# Patient Record
Sex: Male | Born: 1940 | Race: White | Hispanic: No | Marital: Married
Health system: Midwestern US, Community
[De-identification: ages and names within clinical notes are randomized; demographics above are authoritative.]

## PROBLEM LIST (undated history)

## (undated) DIAGNOSIS — E119 Type 2 diabetes mellitus without complications: Secondary | ICD-10-CM

## (undated) DIAGNOSIS — I10 Essential (primary) hypertension: Secondary | ICD-10-CM

## (undated) DIAGNOSIS — R011 Cardiac murmur, unspecified: Secondary | ICD-10-CM

## (undated) DIAGNOSIS — I2510 Atherosclerotic heart disease of native coronary artery without angina pectoris: Secondary | ICD-10-CM

## (undated) DIAGNOSIS — E785 Hyperlipidemia, unspecified: Secondary | ICD-10-CM

## (undated) HISTORY — PX: OTHER SURGICAL HISTORY: SHX169

## (undated) HISTORY — DX: Cardiac murmur, unspecified: R01.1

## (undated) HISTORY — PX: INGUINAL HERNIA REPAIR: SUR1180

## (undated) HISTORY — DX: Hyperlipidemia, unspecified: E78.5

## (undated) HISTORY — PX: CARDIAC CATHETERIZATION: SHX172

## (undated) HISTORY — DX: Type 2 diabetes mellitus without complications: E11.9

---

## 2007-10-28 HISTORY — PX: COLONOSCOPY: SHX174

## 2008-05-12 ENCOUNTER — Ambulatory Visit: Payer: Self-pay | Admitting: Gastroenterology

## 2008-05-26 ENCOUNTER — Encounter: Payer: Self-pay | Admitting: Gastroenterology

## 2008-05-26 ENCOUNTER — Ambulatory Visit: Payer: Self-pay | Admitting: Gastroenterology

## 2008-05-29 ENCOUNTER — Encounter: Payer: Self-pay | Admitting: Gastroenterology

## 2010-12-31 ENCOUNTER — Other Ambulatory Visit: Payer: Self-pay | Admitting: Dermatology

## 2011-10-02 ENCOUNTER — Other Ambulatory Visit: Payer: Self-pay | Admitting: Dermatology

## 2012-10-04 ENCOUNTER — Other Ambulatory Visit: Payer: Self-pay | Admitting: Dermatology

## 2013-10-11 ENCOUNTER — Other Ambulatory Visit: Payer: Self-pay | Admitting: Dermatology

## 2014-08-03 ENCOUNTER — Other Ambulatory Visit: Payer: Self-pay | Admitting: Dermatology

## 2014-10-17 ENCOUNTER — Other Ambulatory Visit: Payer: Self-pay | Admitting: Dermatology

## 2015-11-27 DIAGNOSIS — H401131 Primary open-angle glaucoma, bilateral, mild stage: Secondary | ICD-10-CM | POA: Diagnosis not present

## 2015-11-28 DIAGNOSIS — R69 Illness, unspecified: Secondary | ICD-10-CM | POA: Diagnosis not present

## 2015-12-18 ENCOUNTER — Encounter: Payer: Self-pay | Admitting: Gastroenterology

## 2015-12-25 DIAGNOSIS — L570 Actinic keratosis: Secondary | ICD-10-CM | POA: Diagnosis not present

## 2015-12-25 DIAGNOSIS — D485 Neoplasm of uncertain behavior of skin: Secondary | ICD-10-CM | POA: Diagnosis not present

## 2015-12-25 DIAGNOSIS — Z85828 Personal history of other malignant neoplasm of skin: Secondary | ICD-10-CM | POA: Diagnosis not present

## 2016-02-12 DIAGNOSIS — Z6827 Body mass index (BMI) 27.0-27.9, adult: Secondary | ICD-10-CM | POA: Diagnosis not present

## 2016-02-12 DIAGNOSIS — E119 Type 2 diabetes mellitus without complications: Secondary | ICD-10-CM | POA: Diagnosis not present

## 2016-02-12 DIAGNOSIS — R3915 Urgency of urination: Secondary | ICD-10-CM | POA: Diagnosis not present

## 2016-03-31 DIAGNOSIS — H401131 Primary open-angle glaucoma, bilateral, mild stage: Secondary | ICD-10-CM | POA: Diagnosis not present

## 2016-04-08 DIAGNOSIS — L570 Actinic keratosis: Secondary | ICD-10-CM | POA: Diagnosis not present

## 2016-04-08 DIAGNOSIS — Z85828 Personal history of other malignant neoplasm of skin: Secondary | ICD-10-CM | POA: Diagnosis not present

## 2016-07-07 DIAGNOSIS — Z961 Presence of intraocular lens: Secondary | ICD-10-CM | POA: Diagnosis not present

## 2016-07-07 DIAGNOSIS — E119 Type 2 diabetes mellitus without complications: Secondary | ICD-10-CM | POA: Diagnosis not present

## 2016-07-07 DIAGNOSIS — Z7984 Long term (current) use of oral hypoglycemic drugs: Secondary | ICD-10-CM | POA: Diagnosis not present

## 2016-07-07 DIAGNOSIS — H401131 Primary open-angle glaucoma, bilateral, mild stage: Secondary | ICD-10-CM | POA: Diagnosis not present

## 2016-07-31 DIAGNOSIS — D1801 Hemangioma of skin and subcutaneous tissue: Secondary | ICD-10-CM | POA: Diagnosis not present

## 2016-07-31 DIAGNOSIS — L821 Other seborrheic keratosis: Secondary | ICD-10-CM | POA: Diagnosis not present

## 2016-07-31 DIAGNOSIS — D485 Neoplasm of uncertain behavior of skin: Secondary | ICD-10-CM | POA: Diagnosis not present

## 2016-07-31 DIAGNOSIS — D045 Carcinoma in situ of skin of trunk: Secondary | ICD-10-CM | POA: Diagnosis not present

## 2016-07-31 DIAGNOSIS — L570 Actinic keratosis: Secondary | ICD-10-CM | POA: Diagnosis not present

## 2016-07-31 DIAGNOSIS — D0462 Carcinoma in situ of skin of left upper limb, including shoulder: Secondary | ICD-10-CM | POA: Diagnosis not present

## 2016-07-31 DIAGNOSIS — Z85828 Personal history of other malignant neoplasm of skin: Secondary | ICD-10-CM | POA: Diagnosis not present

## 2016-08-05 DIAGNOSIS — E119 Type 2 diabetes mellitus without complications: Secondary | ICD-10-CM | POA: Diagnosis not present

## 2016-08-05 DIAGNOSIS — Z125 Encounter for screening for malignant neoplasm of prostate: Secondary | ICD-10-CM | POA: Diagnosis not present

## 2016-08-05 DIAGNOSIS — E784 Other hyperlipidemia: Secondary | ICD-10-CM | POA: Diagnosis not present

## 2016-08-08 DIAGNOSIS — Z1212 Encounter for screening for malignant neoplasm of rectum: Secondary | ICD-10-CM | POA: Diagnosis not present

## 2016-08-12 DIAGNOSIS — E119 Type 2 diabetes mellitus without complications: Secondary | ICD-10-CM | POA: Diagnosis not present

## 2016-08-12 DIAGNOSIS — Z23 Encounter for immunization: Secondary | ICD-10-CM | POA: Diagnosis not present

## 2016-08-12 DIAGNOSIS — R3915 Urgency of urination: Secondary | ICD-10-CM | POA: Diagnosis not present

## 2016-08-12 DIAGNOSIS — Z0000 Encounter for general adult medical examination without abnormal findings: Secondary | ICD-10-CM | POA: Diagnosis not present

## 2016-08-12 DIAGNOSIS — B001 Herpesviral vesicular dermatitis: Secondary | ICD-10-CM | POA: Diagnosis not present

## 2016-08-12 DIAGNOSIS — Z6827 Body mass index (BMI) 27.0-27.9, adult: Secondary | ICD-10-CM | POA: Diagnosis not present

## 2016-08-12 DIAGNOSIS — E784 Other hyperlipidemia: Secondary | ICD-10-CM | POA: Diagnosis not present

## 2016-08-12 DIAGNOSIS — Z1389 Encounter for screening for other disorder: Secondary | ICD-10-CM | POA: Diagnosis not present

## 2016-09-08 DIAGNOSIS — R69 Illness, unspecified: Secondary | ICD-10-CM | POA: Diagnosis not present

## 2017-01-20 DIAGNOSIS — H401131 Primary open-angle glaucoma, bilateral, mild stage: Secondary | ICD-10-CM | POA: Diagnosis not present

## 2017-02-24 DIAGNOSIS — R69 Illness, unspecified: Secondary | ICD-10-CM | POA: Diagnosis not present

## 2017-02-25 DIAGNOSIS — M25551 Pain in right hip: Secondary | ICD-10-CM | POA: Diagnosis not present

## 2017-02-25 DIAGNOSIS — E119 Type 2 diabetes mellitus without complications: Secondary | ICD-10-CM | POA: Diagnosis not present

## 2017-02-25 DIAGNOSIS — Z6827 Body mass index (BMI) 27.0-27.9, adult: Secondary | ICD-10-CM | POA: Diagnosis not present

## 2017-02-25 DIAGNOSIS — E784 Other hyperlipidemia: Secondary | ICD-10-CM | POA: Diagnosis not present

## 2017-06-10 DIAGNOSIS — H401131 Primary open-angle glaucoma, bilateral, mild stage: Secondary | ICD-10-CM | POA: Diagnosis not present

## 2017-08-04 DIAGNOSIS — D1801 Hemangioma of skin and subcutaneous tissue: Secondary | ICD-10-CM | POA: Diagnosis not present

## 2017-08-04 DIAGNOSIS — D0359 Melanoma in situ of other part of trunk: Secondary | ICD-10-CM | POA: Diagnosis not present

## 2017-08-04 DIAGNOSIS — L821 Other seborrheic keratosis: Secondary | ICD-10-CM | POA: Diagnosis not present

## 2017-08-04 DIAGNOSIS — D485 Neoplasm of uncertain behavior of skin: Secondary | ICD-10-CM | POA: Diagnosis not present

## 2017-08-04 DIAGNOSIS — L570 Actinic keratosis: Secondary | ICD-10-CM | POA: Diagnosis not present

## 2017-08-04 DIAGNOSIS — Z85828 Personal history of other malignant neoplasm of skin: Secondary | ICD-10-CM | POA: Diagnosis not present

## 2017-08-07 DIAGNOSIS — E7849 Other hyperlipidemia: Secondary | ICD-10-CM | POA: Diagnosis not present

## 2017-08-07 DIAGNOSIS — Z125 Encounter for screening for malignant neoplasm of prostate: Secondary | ICD-10-CM | POA: Diagnosis not present

## 2017-08-07 DIAGNOSIS — Z0000 Encounter for general adult medical examination without abnormal findings: Secondary | ICD-10-CM | POA: Diagnosis not present

## 2017-08-07 DIAGNOSIS — E119 Type 2 diabetes mellitus without complications: Secondary | ICD-10-CM | POA: Diagnosis not present

## 2017-08-14 DIAGNOSIS — R69 Illness, unspecified: Secondary | ICD-10-CM | POA: Diagnosis not present

## 2017-08-18 DIAGNOSIS — D0359 Melanoma in situ of other part of trunk: Secondary | ICD-10-CM | POA: Diagnosis not present

## 2017-08-18 DIAGNOSIS — C4359 Malignant melanoma of other part of trunk: Secondary | ICD-10-CM | POA: Diagnosis not present

## 2017-08-18 DIAGNOSIS — Z85828 Personal history of other malignant neoplasm of skin: Secondary | ICD-10-CM | POA: Diagnosis not present

## 2017-08-24 DIAGNOSIS — Z1212 Encounter for screening for malignant neoplasm of rectum: Secondary | ICD-10-CM | POA: Diagnosis not present

## 2017-09-11 DIAGNOSIS — R030 Elevated blood-pressure reading, without diagnosis of hypertension: Secondary | ICD-10-CM | POA: Diagnosis not present

## 2017-09-11 DIAGNOSIS — R42 Dizziness and giddiness: Secondary | ICD-10-CM | POA: Diagnosis not present

## 2017-09-11 DIAGNOSIS — Z6827 Body mass index (BMI) 27.0-27.9, adult: Secondary | ICD-10-CM | POA: Diagnosis not present

## 2017-09-22 DIAGNOSIS — R69 Illness, unspecified: Secondary | ICD-10-CM | POA: Diagnosis not present

## 2017-09-29 DIAGNOSIS — E119 Type 2 diabetes mellitus without complications: Secondary | ICD-10-CM | POA: Diagnosis not present

## 2017-10-13 DIAGNOSIS — Z0000 Encounter for general adult medical examination without abnormal findings: Secondary | ICD-10-CM | POA: Diagnosis not present

## 2017-10-13 DIAGNOSIS — E119 Type 2 diabetes mellitus without complications: Secondary | ICD-10-CM | POA: Diagnosis not present

## 2017-10-13 DIAGNOSIS — E7849 Other hyperlipidemia: Secondary | ICD-10-CM | POA: Diagnosis not present

## 2017-10-13 DIAGNOSIS — Z1389 Encounter for screening for other disorder: Secondary | ICD-10-CM | POA: Diagnosis not present

## 2017-10-13 DIAGNOSIS — Z6828 Body mass index (BMI) 28.0-28.9, adult: Secondary | ICD-10-CM | POA: Diagnosis not present

## 2017-10-13 DIAGNOSIS — R69 Illness, unspecified: Secondary | ICD-10-CM | POA: Diagnosis not present

## 2017-10-13 DIAGNOSIS — R030 Elevated blood-pressure reading, without diagnosis of hypertension: Secondary | ICD-10-CM | POA: Diagnosis not present

## 2018-01-07 DIAGNOSIS — H401131 Primary open-angle glaucoma, bilateral, mild stage: Secondary | ICD-10-CM | POA: Diagnosis not present

## 2018-02-11 DIAGNOSIS — E1169 Type 2 diabetes mellitus with other specified complication: Secondary | ICD-10-CM | POA: Diagnosis not present

## 2018-02-11 DIAGNOSIS — Z6824 Body mass index (BMI) 24.0-24.9, adult: Secondary | ICD-10-CM | POA: Diagnosis not present

## 2018-02-11 DIAGNOSIS — R030 Elevated blood-pressure reading, without diagnosis of hypertension: Secondary | ICD-10-CM | POA: Diagnosis not present

## 2018-02-11 DIAGNOSIS — E7849 Other hyperlipidemia: Secondary | ICD-10-CM | POA: Diagnosis not present

## 2018-03-12 DIAGNOSIS — R69 Illness, unspecified: Secondary | ICD-10-CM | POA: Diagnosis not present

## 2018-04-08 DIAGNOSIS — H401131 Primary open-angle glaucoma, bilateral, mild stage: Secondary | ICD-10-CM | POA: Diagnosis not present

## 2018-06-17 ENCOUNTER — Encounter: Payer: Self-pay | Admitting: Gastroenterology

## 2018-06-23 ENCOUNTER — Telehealth: Payer: Self-pay | Admitting: Gastroenterology

## 2018-06-23 NOTE — Telephone Encounter (Signed)
Left message for patient to call back  

## 2018-06-24 NOTE — Telephone Encounter (Signed)
I explained to the patient that we do recommend colon cancer screenings through the age of 46.  He is scheduled for colon and pre visit in September

## 2018-07-02 ENCOUNTER — Ambulatory Visit (AMBULATORY_SURGERY_CENTER): Payer: Self-pay

## 2018-07-02 VITALS — Ht 68.0 in | Wt 190.0 lb

## 2018-07-02 DIAGNOSIS — Z86010 Personal history of colonic polyps: Secondary | ICD-10-CM

## 2018-07-02 MED ORDER — NA SULFATE-K SULFATE-MG SULF 17.5-3.13-1.6 GM/177ML PO SOLN: 1.0000 | Freq: Once | ORAL | 0 refills | Status: AC

## 2018-07-02 NOTE — Progress Notes (Signed)
Per pt, no allergies to soy or egg products.Pt not taking any weight loss meds or using  O2 at home.  Pt refused emmi video. 

## 2018-07-03 DIAGNOSIS — Z23 Encounter for immunization: Secondary | ICD-10-CM | POA: Diagnosis not present

## 2018-07-06 DIAGNOSIS — H401112 Primary open-angle glaucoma, right eye, moderate stage: Secondary | ICD-10-CM | POA: Diagnosis not present

## 2018-07-06 DIAGNOSIS — H401121 Primary open-angle glaucoma, left eye, mild stage: Secondary | ICD-10-CM | POA: Diagnosis not present

## 2018-07-09 ENCOUNTER — Encounter: Payer: Self-pay | Admitting: Gastroenterology

## 2018-07-13 DIAGNOSIS — D2371 Other benign neoplasm of skin of right lower limb, including hip: Secondary | ICD-10-CM | POA: Diagnosis not present

## 2018-07-13 DIAGNOSIS — Z85828 Personal history of other malignant neoplasm of skin: Secondary | ICD-10-CM | POA: Diagnosis not present

## 2018-07-13 DIAGNOSIS — Z85820 Personal history of malignant melanoma of skin: Secondary | ICD-10-CM | POA: Diagnosis not present

## 2018-07-13 DIAGNOSIS — L821 Other seborrheic keratosis: Secondary | ICD-10-CM | POA: Diagnosis not present

## 2018-07-13 DIAGNOSIS — L570 Actinic keratosis: Secondary | ICD-10-CM | POA: Diagnosis not present

## 2018-07-19 DIAGNOSIS — M79672 Pain in left foot: Secondary | ICD-10-CM | POA: Diagnosis not present

## 2018-07-19 DIAGNOSIS — E1169 Type 2 diabetes mellitus with other specified complication: Secondary | ICD-10-CM | POA: Diagnosis not present

## 2018-08-06 ENCOUNTER — Encounter: Payer: Self-pay | Admitting: Gastroenterology

## 2018-08-06 ENCOUNTER — Encounter

## 2018-08-25 ENCOUNTER — Encounter: Payer: Self-pay | Admitting: Gastroenterology

## 2018-09-08 ENCOUNTER — Encounter: Payer: Self-pay | Admitting: Gastroenterology

## 2018-10-01 DIAGNOSIS — R69 Illness, unspecified: Secondary | ICD-10-CM | POA: Diagnosis not present

## 2018-10-05 DIAGNOSIS — E119 Type 2 diabetes mellitus without complications: Secondary | ICD-10-CM | POA: Diagnosis not present

## 2018-10-05 DIAGNOSIS — H401131 Primary open-angle glaucoma, bilateral, mild stage: Secondary | ICD-10-CM | POA: Diagnosis not present

## 2018-10-05 DIAGNOSIS — Z961 Presence of intraocular lens: Secondary | ICD-10-CM | POA: Diagnosis not present

## 2018-10-05 DIAGNOSIS — H26493 Other secondary cataract, bilateral: Secondary | ICD-10-CM | POA: Diagnosis not present

## 2018-10-08 DIAGNOSIS — R82998 Other abnormal findings in urine: Secondary | ICD-10-CM | POA: Diagnosis not present

## 2018-10-08 DIAGNOSIS — E7849 Other hyperlipidemia: Secondary | ICD-10-CM | POA: Diagnosis not present

## 2018-10-08 DIAGNOSIS — Z125 Encounter for screening for malignant neoplasm of prostate: Secondary | ICD-10-CM | POA: Diagnosis not present

## 2018-10-08 DIAGNOSIS — E1169 Type 2 diabetes mellitus with other specified complication: Secondary | ICD-10-CM | POA: Diagnosis not present

## 2018-10-12 ENCOUNTER — Ambulatory Visit (AMBULATORY_SURGERY_CENTER): Payer: Medicare HMO | Admitting: Gastroenterology

## 2018-10-12 ENCOUNTER — Encounter: Payer: Self-pay | Admitting: Gastroenterology

## 2018-10-12 VITALS — BP 163/89 | HR 68 | Temp 97.8°F | Resp 24 | Ht 68.0 in | Wt 190.0 lb

## 2018-10-12 DIAGNOSIS — Z1212 Encounter for screening for malignant neoplasm of rectum: Secondary | ICD-10-CM | POA: Diagnosis not present

## 2018-10-12 DIAGNOSIS — D120 Benign neoplasm of cecum: Secondary | ICD-10-CM | POA: Diagnosis not present

## 2018-10-12 DIAGNOSIS — Z1211 Encounter for screening for malignant neoplasm of colon: Secondary | ICD-10-CM

## 2018-10-12 DIAGNOSIS — Z86010 Personal history of colonic polyps: Secondary | ICD-10-CM | POA: Diagnosis not present

## 2018-10-12 MED ORDER — SODIUM CHLORIDE 0.9 % IV SOLN: 500.0000 mL | Freq: Once | INTRAVENOUS | Status: DC

## 2018-10-12 NOTE — Progress Notes (Signed)
Pt's states no medical or surgical changes since previsit or office visit. 

## 2018-10-12 NOTE — Op Note (Signed)
[place] Patient Name: Anthony Adkins Procedure Date: 10/12/2018 12:06 PM MRN: [id number] Endoscopist: Ladene Artist , MD Age: 77 Referring MD:  Date of Birth: 1940/11/04 Gender: Male Account #: [id number] Procedure:                Colonoscopy Indications:              Screening for colorectal malignant neoplasm Medicines:                Monitored Anesthesia Care Procedure:                Pre-Anesthesia Assessment:                           - Prior to the procedure, a History and Physical                            was performed, and patient medications and                            allergies were reviewed. The patient's tolerance of                            previous anesthesia was also reviewed. The risks                            and benefits of the procedure and the sedation                            options and risks were discussed with the patient.                            All questions were answered, and informed consent                            was obtained. Prior Anticoagulants: The patient has                            taken no previous anticoagulant or antiplatelet                            agents. ASA Grade Assessment: II - A patient with                            mild systemic disease. After reviewing the risks                            and benefits, the patient was deemed in                            satisfactory condition to undergo the procedure.                           After obtaining informed consent, the colonoscope  was passed under direct vision. Throughout the                            procedure, the patient's blood pressure, pulse, and                            oxygen saturations were monitored continuously. The                            Colonoscope was introduced through the anus and                            advanced to the the cecum, identified by                            appendiceal orifice and  ileocecal valve. The                            ileocecal valve, appendiceal orifice, and rectum                            were photographed. The quality of the bowel                            preparation was excellent. The patient tolerated                            the procedure well. The colonoscopy was somewhat                            difficult due to a tortuous colon. Successful                            completion of the procedure was aided by                            withdrawing and reinserting the scope,                            straightening and shortening the scope to obtain                            bowel loop reduction, using scope torsion and                            applying abdominal pressure. Scope In: 12:08:40 PM Scope Out: 12:27:22 PM Scope Withdrawal Time: 0 hours 14 minutes 5 seconds  Total Procedure Duration: 0 hours 18 minutes 42 seconds  Findings:                 The perianal and digital rectal examinations were                            normal.  A 8 mm polyp was found in the cecum. The polyp was                            sessile. The polyp was removed with a cold snare.                            Resection and retrieval were complete.                           Multiple medium-mouthed diverticula were found in                            the left colon.                           Internal hemorrhoids were found during                            retroflexion. The hemorrhoids were medium-sized and                            Grade I (internal hemorrhoids that do not prolapse).                           The exam was otherwise without abnormality on                            direct and retroflexion views. Complications:            No immediate complications. Estimated blood loss:                            None. Estimated Blood Loss:     Estimated blood loss: none. Impression:               - One 8 mm polyp in the cecum, removed  with a cold                            snare. Resected and retrieved.                           - Diverticulosis in the left colon.                           - Internal hemorrhoids.                           - The examination was otherwise normal on direct                            and retroflexion views. Recommendation:           - Patient has a contact number available for                            emergencies. The signs and symptoms of potential  delayed complications were discussed with the                            patient. Return to normal activities tomorrow.                            Written discharge instructions were provided to the                            patient.                           - High fiber diet.                           - Continue present medications.                           - Await pathology results.                           - No repeat colonoscopy due to age. Ladene Artist, MD 10/12/2018 12:30:48 PM This report has been signed electronically.

## 2018-10-12 NOTE — Progress Notes (Signed)
Called to room to assist during endoscopic procedure.  Patient ID and intended procedure confirmed with present staff. Received instructions for my participation in the procedure from the performing physician.  

## 2018-10-12 NOTE — Patient Instructions (Signed)
**  Handouts given on polyps, diverticulosis, hemorrhoids, and a high fiber diet**   YOU HAD AN ENDOSCOPIC PROCEDURE TODAY: Refer to the procedure report and other information in the discharge instructions given to you for any specific questions about what was found during the examination. If this information does not answer your questions, please call [place] office at [phone] to clarify.   YOU SHOULD EXPECT: Some feelings of bloating in the abdomen. Passage of more gas than usual. Walking can help get rid of the air that was put into your GI tract during the procedure and reduce the bloating. If you had a lower endoscopy (such as a colonoscopy or flexible sigmoidoscopy) you may notice spotting of blood in your stool or on the toilet paper. Some abdominal soreness may be present for a day or two, also.  DIET: Your first meal following the procedure should be a light meal and then it is ok to progress to your normal diet. A half-sandwich or bowl of soup is an example of a good first meal. Heavy or fried foods are harder to digest and may make you feel nauseous or bloated. Drink plenty of fluids but you should avoid alcoholic beverages for 24 hours. If you had a esophageal dilation, please see attached instructions for diet.    ACTIVITY: Your care partner should take you home directly after the procedure. You should plan to take it easy, moving slowly for the rest of the day. You can resume normal activity the day after the procedure however YOU SHOULD NOT DRIVE, use power tools, machinery or perform tasks that involve climbing or major physical exertion for 24 hours (because of the sedation medicines used during the test).   SYMPTOMS TO REPORT IMMEDIATELY: A gastroenterologist can be reached at any hour. Please call [phone]  for any of the following symptoms:  Following lower endoscopy (colonoscopy, flexible sigmoidoscopy) Excessive amounts of blood in the stool  Significant tenderness,  worsening of abdominal pains  Swelling of the abdomen that is new, acute  Fever of 100 or higher    FOLLOW UP:  If any biopsies were taken you will be contacted by phone or by letter within the next 1-3 weeks. Call [phone]  if you have not heard about the biopsies in 3 weeks.  Please also call with any specific questions about appointments or follow up tests.

## 2018-10-12 NOTE — Progress Notes (Signed)
A/ox3, pleased with MAC, report to RN 

## 2018-10-13 ENCOUNTER — Telehealth: Payer: Self-pay

## 2018-10-13 ENCOUNTER — Telehealth: Payer: Self-pay | Admitting: *Deleted

## 2018-10-13 DIAGNOSIS — E7849 Other hyperlipidemia: Secondary | ICD-10-CM | POA: Diagnosis not present

## 2018-10-13 NOTE — Telephone Encounter (Signed)
  Follow up Call-  Call back number 10/12/2018  Post procedure Call Back phone  # [phone]  Permission to leave phone message Yes  Some recent data might be hidden     Patient questions:  Message left to call us if necessary.

## 2018-10-13 NOTE — Telephone Encounter (Signed)
  Follow up Call-  Call back number 10/12/2018  Post procedure Call Back phone  # [phone]  Permission to leave phone message Yes  Some recent data might be hidden     Patient questions:  Do you have a fever, pain , or abdominal swelling? No. Pain Score  0 *  Have you tolerated food without any problems? Yes.    Have you been able to return to your normal activities? Yes.    Do you have any questions about your discharge instructions: Diet   No. Medications  No. Follow up visit  No.  Do you have questions or concerns about your Care? No.  Actions: * If pain score is 4 or above: No action needed, pain <4.  No problems noted per pt. maw

## 2018-10-15 DIAGNOSIS — Z1389 Encounter for screening for other disorder: Secondary | ICD-10-CM | POA: Diagnosis not present

## 2018-10-15 DIAGNOSIS — E7849 Other hyperlipidemia: Secondary | ICD-10-CM | POA: Diagnosis not present

## 2018-10-15 DIAGNOSIS — Z6828 Body mass index (BMI) 28.0-28.9, adult: Secondary | ICD-10-CM | POA: Diagnosis not present

## 2018-10-15 DIAGNOSIS — E1169 Type 2 diabetes mellitus with other specified complication: Secondary | ICD-10-CM | POA: Diagnosis not present

## 2018-10-15 DIAGNOSIS — Z0000 Encounter for general adult medical examination without abnormal findings: Secondary | ICD-10-CM | POA: Diagnosis not present

## 2018-10-15 DIAGNOSIS — R030 Elevated blood-pressure reading, without diagnosis of hypertension: Secondary | ICD-10-CM | POA: Diagnosis not present

## 2018-10-18 ENCOUNTER — Encounter: Payer: Self-pay | Admitting: Gastroenterology

## 2018-11-05 NOTE — Telephone Encounter (Signed)
Error

## 2018-11-24 DIAGNOSIS — R509 Fever, unspecified: Secondary | ICD-10-CM | POA: Diagnosis not present

## 2018-12-22 DIAGNOSIS — R69 Illness, unspecified: Secondary | ICD-10-CM | POA: Diagnosis not present

## 2018-12-26 DIAGNOSIS — R69 Illness, unspecified: Secondary | ICD-10-CM | POA: Diagnosis not present

## 2019-03-22 DIAGNOSIS — D1801 Hemangioma of skin and subcutaneous tissue: Secondary | ICD-10-CM | POA: Diagnosis not present

## 2019-03-22 DIAGNOSIS — Z85820 Personal history of malignant melanoma of skin: Secondary | ICD-10-CM | POA: Diagnosis not present

## 2019-03-22 DIAGNOSIS — Z85828 Personal history of other malignant neoplasm of skin: Secondary | ICD-10-CM | POA: Diagnosis not present

## 2019-03-22 DIAGNOSIS — L570 Actinic keratosis: Secondary | ICD-10-CM | POA: Diagnosis not present

## 2019-03-22 DIAGNOSIS — D045 Carcinoma in situ of skin of trunk: Secondary | ICD-10-CM | POA: Diagnosis not present

## 2019-03-22 DIAGNOSIS — L821 Other seborrheic keratosis: Secondary | ICD-10-CM | POA: Diagnosis not present

## 2019-04-15 DIAGNOSIS — E1169 Type 2 diabetes mellitus with other specified complication: Secondary | ICD-10-CM | POA: Diagnosis not present

## 2019-04-20 DIAGNOSIS — E785 Hyperlipidemia, unspecified: Secondary | ICD-10-CM | POA: Diagnosis not present

## 2019-04-20 DIAGNOSIS — E1169 Type 2 diabetes mellitus with other specified complication: Secondary | ICD-10-CM | POA: Diagnosis not present

## 2019-04-20 DIAGNOSIS — R69 Illness, unspecified: Secondary | ICD-10-CM | POA: Diagnosis not present

## 2019-04-20 DIAGNOSIS — R030 Elevated blood-pressure reading, without diagnosis of hypertension: Secondary | ICD-10-CM | POA: Diagnosis not present

## 2019-04-22 DIAGNOSIS — R69 Illness, unspecified: Secondary | ICD-10-CM | POA: Diagnosis not present

## 2019-06-06 ENCOUNTER — Other Ambulatory Visit: Payer: Self-pay

## 2019-06-06 DIAGNOSIS — Z20822 Contact with and (suspected) exposure to covid-19: Secondary | ICD-10-CM

## 2019-06-07 LAB — NOVEL CORONAVIRUS, NAA: SARS-CoV-2, NAA: NOT DETECTED

## 2019-06-17 DIAGNOSIS — Z23 Encounter for immunization: Secondary | ICD-10-CM | POA: Diagnosis not present

## 2019-06-22 DIAGNOSIS — R69 Illness, unspecified: Secondary | ICD-10-CM | POA: Diagnosis not present

## 2019-06-23 DIAGNOSIS — R69 Illness, unspecified: Secondary | ICD-10-CM | POA: Diagnosis not present

## 2019-07-20 DIAGNOSIS — R69 Illness, unspecified: Secondary | ICD-10-CM | POA: Diagnosis not present

## 2019-08-05 DIAGNOSIS — E1169 Type 2 diabetes mellitus with other specified complication: Secondary | ICD-10-CM | POA: Diagnosis not present

## 2019-08-25 DIAGNOSIS — R69 Illness, unspecified: Secondary | ICD-10-CM | POA: Diagnosis not present

## 2019-09-06 DIAGNOSIS — M79671 Pain in right foot: Secondary | ICD-10-CM | POA: Diagnosis not present

## 2019-09-06 DIAGNOSIS — M7661 Achilles tendinitis, right leg: Secondary | ICD-10-CM | POA: Diagnosis not present

## 2019-10-10 DIAGNOSIS — E119 Type 2 diabetes mellitus without complications: Secondary | ICD-10-CM | POA: Diagnosis not present

## 2019-10-11 DIAGNOSIS — R82998 Other abnormal findings in urine: Secondary | ICD-10-CM | POA: Diagnosis not present

## 2019-10-11 DIAGNOSIS — Z125 Encounter for screening for malignant neoplasm of prostate: Secondary | ICD-10-CM | POA: Diagnosis not present

## 2019-10-11 DIAGNOSIS — E7849 Other hyperlipidemia: Secondary | ICD-10-CM | POA: Diagnosis not present

## 2019-10-11 DIAGNOSIS — E1169 Type 2 diabetes mellitus with other specified complication: Secondary | ICD-10-CM | POA: Diagnosis not present

## 2019-10-18 DIAGNOSIS — Z0000 Encounter for general adult medical examination without abnormal findings: Secondary | ICD-10-CM | POA: Diagnosis not present

## 2019-10-18 DIAGNOSIS — E1169 Type 2 diabetes mellitus with other specified complication: Secondary | ICD-10-CM | POA: Diagnosis not present

## 2019-10-18 DIAGNOSIS — Z1331 Encounter for screening for depression: Secondary | ICD-10-CM | POA: Diagnosis not present

## 2019-10-18 DIAGNOSIS — E785 Hyperlipidemia, unspecified: Secondary | ICD-10-CM | POA: Diagnosis not present

## 2019-10-18 DIAGNOSIS — R030 Elevated blood-pressure reading, without diagnosis of hypertension: Secondary | ICD-10-CM | POA: Diagnosis not present

## 2019-10-18 DIAGNOSIS — R69 Illness, unspecified: Secondary | ICD-10-CM | POA: Diagnosis not present

## 2019-10-18 DIAGNOSIS — M79671 Pain in right foot: Secondary | ICD-10-CM | POA: Diagnosis not present

## 2019-10-19 DIAGNOSIS — Z1212 Encounter for screening for malignant neoplasm of rectum: Secondary | ICD-10-CM | POA: Diagnosis not present

## 2019-10-24 DIAGNOSIS — R69 Illness, unspecified: Secondary | ICD-10-CM | POA: Diagnosis not present

## 2019-12-13 ENCOUNTER — Ambulatory Visit: Payer: Medicare HMO

## 2019-12-13 ENCOUNTER — Other Ambulatory Visit: Payer: Self-pay

## 2019-12-13 ENCOUNTER — Encounter: Payer: Self-pay | Admitting: Podiatry

## 2019-12-13 ENCOUNTER — Ambulatory Visit (INDEPENDENT_AMBULATORY_CARE_PROVIDER_SITE_OTHER): Payer: Medicare HMO | Admitting: Podiatry

## 2019-12-13 VITALS — BP 140/78 | HR 65 | Resp 16

## 2019-12-13 DIAGNOSIS — M722 Plantar fascial fibromatosis: Secondary | ICD-10-CM

## 2019-12-13 DIAGNOSIS — M7661 Achilles tendinitis, right leg: Secondary | ICD-10-CM

## 2019-12-13 MED ORDER — METHYLPREDNISOLONE 4 MG PO TBPK: ORAL_TABLET | ORAL | 0 refills | Status: DC

## 2019-12-13 MED ORDER — MELOXICAM 15 MG PO TABS: 15.0000 mg | ORAL_TABLET | Freq: Every day | ORAL | 3 refills | Status: DC

## 2019-12-13 NOTE — Progress Notes (Signed)
Subjective:  Patient ID: Anthony Adkins, male    DOB: Apr 25, 1941,  MRN: [id number] HPI Chief Complaint  Patient presents with  . Foot Pain    Posterior heel right - aching x 6 weeks, swollen, "injury to the tendon underneath 6 months ago", tried Aleve-PRN, xrays done and brought disc    79 y.o. male presents with the above complaint.   ROS: Denies fever chills nausea vomiting muscle aches pains calf pain back pain chest pain shortness of breath.  States that it hurts most walking down steps.  Past Medical History:  Diagnosis Date  . Diabetes mellitus without complication ([place])   . Heart murmur    had rheumatic fever as a child  . Hyperlipidemia    Past Surgical History:  Procedure Laterality Date  . COLONOSCOPY  2009  . INGUINAL HERNIA REPAIR     left side/over 20 years ago  . lymph nodes     removed left side neck due to cat scratch fever    Current Outpatient Medications:  .  meloxicam (MOBIC) 15 MG tablet, Take 1 tablet (15 mg total) by mouth daily., Disp: 30 tablet, Rfl: 3 .  methylPREDNISolone (MEDROL DOSEPAK) 4 MG TBPK tablet, 6 day dose pack - take as directed, Disp: 21 tablet, Rfl: 0 .  simvastatin (ZOCOR) 10 MG tablet, Take 10 mg by mouth daily., Disp: , Rfl:   Allergies  Allergen Reactions  . Sulfur     Swelling around neck.   Review of Systems Objective:   Vitals:   12/13/19 0829  BP: 140/78  Pulse: 65  Resp: 16    General: Well developed, nourished, in no acute distress, alert and oriented x3   Dermatological: Skin is warm, dry and supple bilateral. Nails x 10 are well maintained; remaining integument appears unremarkable at this time. There are no open sores, no preulcerative lesions, no rash or signs of infection present.  Vascular: Dorsalis Pedis artery and Posterior Tibial artery pedal pulses are 2/4 bilateral with immedate capillary fill time. Pedal hair growth present. No varicosities and no lower extremity edema present bilateral.    Neruologic: Grossly intact via light touch bilateral. Vibratory intact via tuning fork bilateral. Protective threshold with Semmes Wienstein monofilament intact to all pedal sites bilateral. Patellar and Achilles deep tendon reflexes 2+ bilateral. No Babinski or clonus noted bilateral.   Musculoskeletal: No gross boney pedal deformities bilateral. No pain, crepitus, or limitation noted with foot and ankle range of motion bilateral. Muscular strength 5/5 in all groups tested bilateral.  No pain on palpation medial calcaneal tubercle of the right heel though he does have pain on palpation of the distalmost aspect of the Achilles as it inserts on the posterior calcaneus.  There is a large inoculated area nonpulsatile in nature considered to be bone.  Gait: Unassisted, Nonantalgic.    Radiographs:  Radiographs reviewed today 2 views AP of the foot and ankle and a nonweightbearing lateral does demonstrate soft tissue swelling of the distal Achilles and a retrocalcaneal heel spur.  No acute abnormalities are identified.  Assessment & Plan:   Assessment: Distal Achilles tendinopathy with tendinitis.  Right foot.   Plan: Discussed etiology pathology conservative versus surgical therapies at this point went ahead and injected 2 mg of dexamethasone at the point of maximal tenderness subcutaneously not making sure not to inject into the tendon after sterile Betadine skin prep.  He tolerated procedure well without complications.  We will run him on a short dose of methylprednisolone to  assist in the reduction of the inflammation.  At the same time we are going to put him in a cam walker.  We will also extend his anti-inflammatory effect with meloxicam.  We did discuss icing the foot.  I will follow-up with him in 1 month.     Cale Bethard T. [place], [place]

## 2020-01-12 ENCOUNTER — Ambulatory Visit (INDEPENDENT_AMBULATORY_CARE_PROVIDER_SITE_OTHER): Payer: Medicare HMO | Admitting: Podiatry

## 2020-01-12 ENCOUNTER — Other Ambulatory Visit: Payer: Self-pay

## 2020-01-12 DIAGNOSIS — M7661 Achilles tendinitis, right leg: Secondary | ICD-10-CM | POA: Diagnosis not present

## 2020-01-12 NOTE — Progress Notes (Signed)
He presents today for follow-up of his Achilles tendinitis he states that there is been a major improvement from the last injection.  He states that is about 90 to 95% improved at this point.  Objective: Vital signs are stable alert and oriented x3.  Pulses are palpable.  He has no reproducible pain on palpation to the right heel.  Assessment: Resolving retrocalcaneal bursitis and Achilles tendinitis.  Plan: Continue meloxicam and follow-up with me as needed.

## 2020-01-17 DIAGNOSIS — R69 Illness, unspecified: Secondary | ICD-10-CM | POA: Diagnosis not present

## 2020-01-17 DIAGNOSIS — M7661 Achilles tendinitis, right leg: Secondary | ICD-10-CM | POA: Diagnosis not present

## 2020-01-17 DIAGNOSIS — E1169 Type 2 diabetes mellitus with other specified complication: Secondary | ICD-10-CM | POA: Diagnosis not present

## 2020-01-17 DIAGNOSIS — M25551 Pain in right hip: Secondary | ICD-10-CM | POA: Diagnosis not present

## 2020-01-17 DIAGNOSIS — R030 Elevated blood-pressure reading, without diagnosis of hypertension: Secondary | ICD-10-CM | POA: Diagnosis not present

## 2020-02-20 DIAGNOSIS — R69 Illness, unspecified: Secondary | ICD-10-CM | POA: Diagnosis not present

## 2020-03-12 DIAGNOSIS — D2262 Melanocytic nevi of left upper limb, including shoulder: Secondary | ICD-10-CM | POA: Diagnosis not present

## 2020-03-12 DIAGNOSIS — D692 Other nonthrombocytopenic purpura: Secondary | ICD-10-CM | POA: Diagnosis not present

## 2020-03-12 DIAGNOSIS — L821 Other seborrheic keratosis: Secondary | ICD-10-CM | POA: Diagnosis not present

## 2020-03-12 DIAGNOSIS — Z85828 Personal history of other malignant neoplasm of skin: Secondary | ICD-10-CM | POA: Diagnosis not present

## 2020-03-12 DIAGNOSIS — D225 Melanocytic nevi of trunk: Secondary | ICD-10-CM | POA: Diagnosis not present

## 2020-03-12 DIAGNOSIS — L570 Actinic keratosis: Secondary | ICD-10-CM | POA: Diagnosis not present

## 2020-03-12 DIAGNOSIS — Z85820 Personal history of malignant melanoma of skin: Secondary | ICD-10-CM | POA: Diagnosis not present

## 2020-03-14 ENCOUNTER — Ambulatory Visit: Payer: Medicare HMO | Admitting: Podiatrist

## 2020-03-14 ENCOUNTER — Other Ambulatory Visit: Payer: Self-pay

## 2020-03-14 DIAGNOSIS — M7751 Other enthesopathy of right foot: Secondary | ICD-10-CM | POA: Diagnosis not present

## 2020-03-14 DIAGNOSIS — M7661 Achilles tendinitis, right leg: Secondary | ICD-10-CM | POA: Diagnosis not present

## 2020-03-14 MED ORDER — METHYLPREDNISOLONE 4 MG PO TBPK: ORAL_TABLET | ORAL | 1 refills | Status: DC

## 2020-03-21 ENCOUNTER — Encounter: Payer: Self-pay | Admitting: Podiatrist

## 2020-03-21 NOTE — Progress Notes (Signed)
  Chief Complaint  Patient presents with  . Foot Pain    Pt states posterior/lateral aspect of heel and achilles has sharp pain, 2 week duration. Pt states it began after a long walk.     HPI: Patient is 79 y.o. male who presents today for the concerns as listed above.  He states his achilles tendon pain had been doing well then he went on a long walk 2 weeks ago and it flared back up again. He is requesting in injection today   Review of Systems No fevers, chills, nausea, muscle aches, no difficulty breathing, no calf pain, no chest pain or shortness of breath.   Physical Exam  GENERAL APPEARANCE: Alert, conversant. Appropriately groomed. No acute distress.   VASCULAR: Pedal pulses palpable DP and PT bilateral.  Capillary refill time is immediate to all digits,  Proximal to distal cooling it warm to warm.  Digital hair growth is present bilateral   NEUROLOGIC: sensation is intact epicritically and protectively to 5.07 monofilament at 5/5 sites bilateral.  Light touch is intact bilateral, vibratory sensation intact bilateral, achilles tendon reflex is intact bilateral.   MUSCULOSKELETAL: acceptable muscle strength, tone and stability bilateral.  No gross boney pedal deformities noted.  Pain on palpation posterior lateral aspect of the right heel.  Pain with medial to lateral compression and direct pressure noted at the area of retrocalcaneal bursae.  Achilles tendon itself palpated and noted to be intact.   DERMATOLOGIC: skin is warm, supple, and dry.  No open lesions noted.  No rash, no pre ulcerative lesions. Digital nails are asymptomatic.      Assessment   Flare up of retrocalcaneal bursitis and achilles tendonitis  Plan  An injection was agreed upon and performed under sterile technique as stated below.  rx for medrol dose pack also written. He will stay in the cam boot for at least 2 more weeks and will call if any concerns arise.   Procedure: Injection right posterior  lateral heel Discussed alternatives, risks, complications and verbal consent was obtained.  Location: posterior lateral heel- superficial to achilles tendon Skin Prep: Alcohol. Injectate: 0.5cc 0.5% marcaine plain, 0.5 cc 4 mg dexamethasone phosphate Disposition: Patient tolerated procedure well. Injection site dressed with a band-aid.  Post-injection care was discussed and return precautions discussed.

## 2020-04-26 ENCOUNTER — Encounter: Payer: Self-pay | Admitting: Podiatry

## 2020-04-26 ENCOUNTER — Other Ambulatory Visit: Payer: Self-pay

## 2020-04-26 ENCOUNTER — Ambulatory Visit: Payer: Medicare HMO | Admitting: Podiatry

## 2020-04-26 DIAGNOSIS — M7661 Achilles tendinitis, right leg: Secondary | ICD-10-CM

## 2020-04-26 DIAGNOSIS — M7751 Other enthesopathy of right foot: Secondary | ICD-10-CM

## 2020-04-26 NOTE — Progress Notes (Signed)
He presents today for follow-up of his Achilles tendinitis right.  Some days are great other days or crazy he would like to consider other options.  Objective: Vital signs are stable alert oriented x3.  There is no erythema edema cellulitis drainage or odor though he does have pain on palpation to the lateral fibers of the Achilles posteriorly.  Assessment: Insertional Achilles tendinitis retrocalcaneal heel spur tight hamstrings tight gastrosoleus complex.  Plan: Discussed etiology pathology conservative surgical therapies I injected the area with 2 mg of dexamethasone for maximal tenderness lateral aspect of the Achilles not injecting into the Achilles itself.  I will also get him set up with [facility] discal therapy for evaluation and treatment of hamstrings gastrosoleus complex and Achilles tendon.

## 2020-04-27 ENCOUNTER — Telehealth: Payer: Self-pay | Admitting: *Deleted

## 2020-04-27 DIAGNOSIS — M7751 Other enthesopathy of right foot: Secondary | ICD-10-CM

## 2020-04-27 DIAGNOSIS — M7661 Achilles tendinitis, right leg: Secondary | ICD-10-CM

## 2020-04-27 NOTE — Telephone Encounter (Signed)
Faxed required form, [employer] and insurance to [employer].

## 2020-04-27 NOTE — Telephone Encounter (Signed)
-----   Message from Rip Harbour, [facility] sent at 04/26/2020 11:23 AM EDT ----- Regarding: PT PT In house [facility] -   Evaluate and treat - tight hamstring/gastroc and achilles right   Duration: 3 x week x 4 weeks

## 2020-05-07 DIAGNOSIS — E118 Type 2 diabetes mellitus with unspecified complications: Secondary | ICD-10-CM | POA: Diagnosis not present

## 2020-05-07 DIAGNOSIS — M62561 Muscle wasting and atrophy, not elsewhere classified, right lower leg: Secondary | ICD-10-CM | POA: Diagnosis not present

## 2020-05-07 DIAGNOSIS — M7661 Achilles tendinitis, right leg: Secondary | ICD-10-CM | POA: Diagnosis not present

## 2020-05-07 DIAGNOSIS — R269 Unspecified abnormalities of gait and mobility: Secondary | ICD-10-CM | POA: Diagnosis not present

## 2020-05-07 DIAGNOSIS — M25671 Stiffness of right ankle, not elsewhere classified: Secondary | ICD-10-CM | POA: Diagnosis not present

## 2020-05-07 DIAGNOSIS — M25571 Pain in right ankle and joints of right foot: Secondary | ICD-10-CM | POA: Diagnosis not present

## 2020-05-07 DIAGNOSIS — M25471 Effusion, right ankle: Secondary | ICD-10-CM | POA: Diagnosis not present

## 2020-05-09 DIAGNOSIS — M25571 Pain in right ankle and joints of right foot: Secondary | ICD-10-CM | POA: Diagnosis not present

## 2020-05-09 DIAGNOSIS — R269 Unspecified abnormalities of gait and mobility: Secondary | ICD-10-CM | POA: Diagnosis not present

## 2020-05-09 DIAGNOSIS — M7661 Achilles tendinitis, right leg: Secondary | ICD-10-CM | POA: Diagnosis not present

## 2020-05-09 DIAGNOSIS — M25471 Effusion, right ankle: Secondary | ICD-10-CM | POA: Diagnosis not present

## 2020-05-09 DIAGNOSIS — M25671 Stiffness of right ankle, not elsewhere classified: Secondary | ICD-10-CM | POA: Diagnosis not present

## 2020-05-09 DIAGNOSIS — E118 Type 2 diabetes mellitus with unspecified complications: Secondary | ICD-10-CM | POA: Diagnosis not present

## 2020-05-09 DIAGNOSIS — M62561 Muscle wasting and atrophy, not elsewhere classified, right lower leg: Secondary | ICD-10-CM | POA: Diagnosis not present

## 2020-05-11 DIAGNOSIS — M62561 Muscle wasting and atrophy, not elsewhere classified, right lower leg: Secondary | ICD-10-CM | POA: Diagnosis not present

## 2020-05-11 DIAGNOSIS — M25671 Stiffness of right ankle, not elsewhere classified: Secondary | ICD-10-CM | POA: Diagnosis not present

## 2020-05-11 DIAGNOSIS — M25571 Pain in right ankle and joints of right foot: Secondary | ICD-10-CM | POA: Diagnosis not present

## 2020-05-11 DIAGNOSIS — E118 Type 2 diabetes mellitus with unspecified complications: Secondary | ICD-10-CM | POA: Diagnosis not present

## 2020-05-11 DIAGNOSIS — M25471 Effusion, right ankle: Secondary | ICD-10-CM | POA: Diagnosis not present

## 2020-05-11 DIAGNOSIS — M7661 Achilles tendinitis, right leg: Secondary | ICD-10-CM | POA: Diagnosis not present

## 2020-05-11 DIAGNOSIS — R269 Unspecified abnormalities of gait and mobility: Secondary | ICD-10-CM | POA: Diagnosis not present

## 2020-05-14 DIAGNOSIS — M62561 Muscle wasting and atrophy, not elsewhere classified, right lower leg: Secondary | ICD-10-CM | POA: Diagnosis not present

## 2020-05-14 DIAGNOSIS — E118 Type 2 diabetes mellitus with unspecified complications: Secondary | ICD-10-CM | POA: Diagnosis not present

## 2020-05-14 DIAGNOSIS — R269 Unspecified abnormalities of gait and mobility: Secondary | ICD-10-CM | POA: Diagnosis not present

## 2020-05-14 DIAGNOSIS — M25471 Effusion, right ankle: Secondary | ICD-10-CM | POA: Diagnosis not present

## 2020-05-14 DIAGNOSIS — M7661 Achilles tendinitis, right leg: Secondary | ICD-10-CM | POA: Diagnosis not present

## 2020-05-14 DIAGNOSIS — M25671 Stiffness of right ankle, not elsewhere classified: Secondary | ICD-10-CM | POA: Diagnosis not present

## 2020-05-14 DIAGNOSIS — M25571 Pain in right ankle and joints of right foot: Secondary | ICD-10-CM | POA: Diagnosis not present

## 2020-05-16 DIAGNOSIS — R269 Unspecified abnormalities of gait and mobility: Secondary | ICD-10-CM | POA: Diagnosis not present

## 2020-05-16 DIAGNOSIS — M25671 Stiffness of right ankle, not elsewhere classified: Secondary | ICD-10-CM | POA: Diagnosis not present

## 2020-05-16 DIAGNOSIS — M7661 Achilles tendinitis, right leg: Secondary | ICD-10-CM | POA: Diagnosis not present

## 2020-05-16 DIAGNOSIS — E118 Type 2 diabetes mellitus with unspecified complications: Secondary | ICD-10-CM | POA: Diagnosis not present

## 2020-05-16 DIAGNOSIS — M25471 Effusion, right ankle: Secondary | ICD-10-CM | POA: Diagnosis not present

## 2020-05-16 DIAGNOSIS — M62561 Muscle wasting and atrophy, not elsewhere classified, right lower leg: Secondary | ICD-10-CM | POA: Diagnosis not present

## 2020-05-16 DIAGNOSIS — M25571 Pain in right ankle and joints of right foot: Secondary | ICD-10-CM | POA: Diagnosis not present

## 2020-05-18 DIAGNOSIS — M62561 Muscle wasting and atrophy, not elsewhere classified, right lower leg: Secondary | ICD-10-CM | POA: Diagnosis not present

## 2020-05-18 DIAGNOSIS — R269 Unspecified abnormalities of gait and mobility: Secondary | ICD-10-CM | POA: Diagnosis not present

## 2020-05-18 DIAGNOSIS — M25571 Pain in right ankle and joints of right foot: Secondary | ICD-10-CM | POA: Diagnosis not present

## 2020-05-18 DIAGNOSIS — M25671 Stiffness of right ankle, not elsewhere classified: Secondary | ICD-10-CM | POA: Diagnosis not present

## 2020-05-18 DIAGNOSIS — E118 Type 2 diabetes mellitus with unspecified complications: Secondary | ICD-10-CM | POA: Diagnosis not present

## 2020-05-18 DIAGNOSIS — M25471 Effusion, right ankle: Secondary | ICD-10-CM | POA: Diagnosis not present

## 2020-05-18 DIAGNOSIS — M7661 Achilles tendinitis, right leg: Secondary | ICD-10-CM | POA: Diagnosis not present

## 2020-05-21 DIAGNOSIS — R269 Unspecified abnormalities of gait and mobility: Secondary | ICD-10-CM | POA: Diagnosis not present

## 2020-05-21 DIAGNOSIS — M25571 Pain in right ankle and joints of right foot: Secondary | ICD-10-CM | POA: Diagnosis not present

## 2020-05-21 DIAGNOSIS — M62561 Muscle wasting and atrophy, not elsewhere classified, right lower leg: Secondary | ICD-10-CM | POA: Diagnosis not present

## 2020-05-21 DIAGNOSIS — M7661 Achilles tendinitis, right leg: Secondary | ICD-10-CM | POA: Diagnosis not present

## 2020-05-21 DIAGNOSIS — E118 Type 2 diabetes mellitus with unspecified complications: Secondary | ICD-10-CM | POA: Diagnosis not present

## 2020-05-21 DIAGNOSIS — M25471 Effusion, right ankle: Secondary | ICD-10-CM | POA: Diagnosis not present

## 2020-05-21 DIAGNOSIS — M25671 Stiffness of right ankle, not elsewhere classified: Secondary | ICD-10-CM | POA: Diagnosis not present

## 2020-05-24 DIAGNOSIS — E118 Type 2 diabetes mellitus with unspecified complications: Secondary | ICD-10-CM | POA: Diagnosis not present

## 2020-05-24 DIAGNOSIS — M25671 Stiffness of right ankle, not elsewhere classified: Secondary | ICD-10-CM | POA: Diagnosis not present

## 2020-05-24 DIAGNOSIS — M62561 Muscle wasting and atrophy, not elsewhere classified, right lower leg: Secondary | ICD-10-CM | POA: Diagnosis not present

## 2020-05-24 DIAGNOSIS — M25571 Pain in right ankle and joints of right foot: Secondary | ICD-10-CM | POA: Diagnosis not present

## 2020-05-24 DIAGNOSIS — M7661 Achilles tendinitis, right leg: Secondary | ICD-10-CM | POA: Diagnosis not present

## 2020-05-24 DIAGNOSIS — R269 Unspecified abnormalities of gait and mobility: Secondary | ICD-10-CM | POA: Diagnosis not present

## 2020-05-24 DIAGNOSIS — M25471 Effusion, right ankle: Secondary | ICD-10-CM | POA: Diagnosis not present

## 2020-05-29 DIAGNOSIS — E118 Type 2 diabetes mellitus with unspecified complications: Secondary | ICD-10-CM | POA: Diagnosis not present

## 2020-05-29 DIAGNOSIS — M7661 Achilles tendinitis, right leg: Secondary | ICD-10-CM | POA: Diagnosis not present

## 2020-05-29 DIAGNOSIS — M25471 Effusion, right ankle: Secondary | ICD-10-CM | POA: Diagnosis not present

## 2020-05-29 DIAGNOSIS — M62561 Muscle wasting and atrophy, not elsewhere classified, right lower leg: Secondary | ICD-10-CM | POA: Diagnosis not present

## 2020-05-29 DIAGNOSIS — M25671 Stiffness of right ankle, not elsewhere classified: Secondary | ICD-10-CM | POA: Diagnosis not present

## 2020-05-29 DIAGNOSIS — R269 Unspecified abnormalities of gait and mobility: Secondary | ICD-10-CM | POA: Diagnosis not present

## 2020-05-29 DIAGNOSIS — M25571 Pain in right ankle and joints of right foot: Secondary | ICD-10-CM | POA: Diagnosis not present

## 2020-05-31 DIAGNOSIS — M25671 Stiffness of right ankle, not elsewhere classified: Secondary | ICD-10-CM | POA: Diagnosis not present

## 2020-05-31 DIAGNOSIS — M62561 Muscle wasting and atrophy, not elsewhere classified, right lower leg: Secondary | ICD-10-CM | POA: Diagnosis not present

## 2020-05-31 DIAGNOSIS — R269 Unspecified abnormalities of gait and mobility: Secondary | ICD-10-CM | POA: Diagnosis not present

## 2020-05-31 DIAGNOSIS — E118 Type 2 diabetes mellitus with unspecified complications: Secondary | ICD-10-CM | POA: Diagnosis not present

## 2020-05-31 DIAGNOSIS — M7661 Achilles tendinitis, right leg: Secondary | ICD-10-CM | POA: Diagnosis not present

## 2020-05-31 DIAGNOSIS — M25571 Pain in right ankle and joints of right foot: Secondary | ICD-10-CM | POA: Diagnosis not present

## 2020-05-31 DIAGNOSIS — M25471 Effusion, right ankle: Secondary | ICD-10-CM | POA: Diagnosis not present

## 2020-10-17 ENCOUNTER — Emergency Department (HOSPITAL_COMMUNITY): Payer: Medicare HMO

## 2020-10-17 ENCOUNTER — Encounter (HOSPITAL_COMMUNITY): Payer: Self-pay | Admitting: Emergency Medicine

## 2020-10-17 ENCOUNTER — Observation Stay (HOSPITAL_COMMUNITY)
Admission: EM | Admit: 2020-10-17 | Discharge: 2020-10-18 | Disposition: A | Discharge status: Home / Self Care | Payer: Medicare HMO | Attending: Internal Medicine | Admitting: Internal Medicine

## 2020-10-17 ENCOUNTER — Other Ambulatory Visit: Payer: Self-pay

## 2020-10-17 DIAGNOSIS — E785 Hyperlipidemia, unspecified: Secondary | ICD-10-CM | POA: Diagnosis not present

## 2020-10-17 DIAGNOSIS — I499 Cardiac arrhythmia, unspecified: Secondary | ICD-10-CM | POA: Diagnosis not present

## 2020-10-17 DIAGNOSIS — I214 Non-ST elevation (NSTEMI) myocardial infarction: Principal | ICD-10-CM | POA: Insufficient documentation

## 2020-10-17 DIAGNOSIS — R0789 Other chest pain: Secondary | ICD-10-CM | POA: Diagnosis not present

## 2020-10-17 DIAGNOSIS — I491 Atrial premature depolarization: Secondary | ICD-10-CM | POA: Diagnosis not present

## 2020-10-17 DIAGNOSIS — F17210 Nicotine dependence, cigarettes, uncomplicated: Secondary | ICD-10-CM | POA: Insufficient documentation

## 2020-10-17 DIAGNOSIS — Z8249 Family history of ischemic heart disease and other diseases of the circulatory system: Secondary | ICD-10-CM | POA: Diagnosis not present

## 2020-10-17 DIAGNOSIS — R030 Elevated blood-pressure reading, without diagnosis of hypertension: Secondary | ICD-10-CM | POA: Diagnosis not present

## 2020-10-17 DIAGNOSIS — Z95820 Peripheral vascular angioplasty status with implants and grafts: Secondary | ICD-10-CM

## 2020-10-17 DIAGNOSIS — Z7984 Long term (current) use of oral hypoglycemic drugs: Secondary | ICD-10-CM | POA: Insufficient documentation

## 2020-10-17 DIAGNOSIS — I25110 Atherosclerotic heart disease of native coronary artery with unstable angina pectoris: Secondary | ICD-10-CM | POA: Diagnosis not present

## 2020-10-17 DIAGNOSIS — Z20822 Contact with and (suspected) exposure to covid-19: Secondary | ICD-10-CM | POA: Diagnosis not present

## 2020-10-17 DIAGNOSIS — R079 Chest pain, unspecified: Secondary | ICD-10-CM | POA: Diagnosis not present

## 2020-10-17 DIAGNOSIS — R0602 Shortness of breath: Secondary | ICD-10-CM | POA: Diagnosis not present

## 2020-10-17 DIAGNOSIS — I200 Unstable angina: Secondary | ICD-10-CM | POA: Diagnosis present

## 2020-10-17 DIAGNOSIS — Z882 Allergy status to sulfonamides status: Secondary | ICD-10-CM | POA: Insufficient documentation

## 2020-10-17 DIAGNOSIS — E119 Type 2 diabetes mellitus without complications: Secondary | ICD-10-CM

## 2020-10-17 DIAGNOSIS — G4489 Other headache syndrome: Secondary | ICD-10-CM | POA: Diagnosis not present

## 2020-10-17 DIAGNOSIS — R7309 Other abnormal glucose: Secondary | ICD-10-CM | POA: Diagnosis not present

## 2020-10-17 DIAGNOSIS — R69 Illness, unspecified: Secondary | ICD-10-CM | POA: Diagnosis not present

## 2020-10-17 DIAGNOSIS — I2510 Atherosclerotic heart disease of native coronary artery without angina pectoris: Secondary | ICD-10-CM

## 2020-10-17 DIAGNOSIS — I10 Essential (primary) hypertension: Secondary | ICD-10-CM

## 2020-10-17 LAB — HEMOGLOBIN A1C
Hgb A1c MFr Bld: 6.2 % — ABNORMAL HIGH (ref 4.8–5.6)
Mean Plasma Glucose: 131.24 mg/dL

## 2020-10-17 LAB — BASIC METABOLIC PANEL
Anion gap: 10 (ref 5–15)
BUN: 11 mg/dL (ref 8–23)
CO2: 25 mmol/L (ref 22–32)
Calcium: 8.9 mg/dL (ref 8.9–10.3)
Chloride: 103 mmol/L (ref 98–111)
Creatinine, Ser: 0.94 mg/dL (ref 0.61–1.24)
GFR, Estimated: >60 mL/min (ref >60)
Glucose, Bld: 129 mg/dL — ABNORMAL HIGH (ref 70–99)
Potassium: 4.1 mmol/L (ref 3.5–5.1)
Sodium: 138 mmol/L (ref 135–145)

## 2020-10-17 LAB — CBC
HCT: 42.3 % (ref 39.0–52.0)
Hemoglobin: 14.2 g/dL (ref 13.0–17.0)
MCH: 32.8 pg (ref 26.0–34.0)
MCHC: 33.6 g/dL (ref 30.0–36.0)
MCV: 97.7 fL (ref 80.0–100.0)
Platelets: 234 10*3/uL (ref 150–400)
RBC: 4.33 MIL/uL (ref 4.22–5.81)
RDW: 12.1 % (ref 11.5–15.5)
WBC: 11.7 10*3/uL — ABNORMAL HIGH (ref 4.0–10.5)
nRBC: 0 % (ref 0.0–0.2)

## 2020-10-17 LAB — RESP PANEL BY RT-PCR (FLU A&B, COVID) ARPGX2
Influenza A by PCR: NEGATIVE
Influenza B by PCR: NEGATIVE
SARS Coronavirus 2 by RT PCR: NEGATIVE

## 2020-10-17 LAB — CBG MONITORING, ED: Glucose-Capillary: 165 mg/dL — ABNORMAL HIGH (ref 70–99)

## 2020-10-17 LAB — TROPONIN I (HIGH SENSITIVITY)
Troponin I (High Sensitivity): 25 ng/L — ABNORMAL HIGH (ref <18)
Troponin I (High Sensitivity): 26 ng/L — ABNORMAL HIGH (ref <18)

## 2020-10-17 MED ORDER — ONDANSETRON HCL 4 MG/2ML IJ SOLN: 4.0000 mg | Freq: Four times a day (QID) | INTRAMUSCULAR | Status: DC | PRN

## 2020-10-17 MED ORDER — ONE-A-DAY MENS 50+ ADVANTAGE PO TABS: 1.0000 | ORAL_TABLET | Freq: Every day | ORAL | Status: DC

## 2020-10-17 MED ORDER — ASPIRIN EC 81 MG PO TBEC
81.0000 mg | DELAYED_RELEASE_TABLET | Freq: Every day | ORAL | Status: DC
  Administered 2020-10-17: 81 mg via ORAL
  Filled 2020-10-17: qty 1

## 2020-10-17 MED ORDER — INSULIN ASPART 100 UNIT/ML ~~LOC~~ SOLN
0.0000 [IU] | SUBCUTANEOUS | Status: DC
  Administered 2020-10-17: 2 [IU] via SUBCUTANEOUS
  Administered 2020-10-18: 1 [IU] via SUBCUTANEOUS

## 2020-10-17 MED ORDER — ADULT MULTIVITAMIN W/MINERALS CH: 1.0000 | ORAL_TABLET | Freq: Every day | ORAL | Status: DC

## 2020-10-17 MED ORDER — ACETAMINOPHEN 325 MG PO TABS: 650.0000 mg | ORAL_TABLET | ORAL | Status: DC | PRN

## 2020-10-17 MED ORDER — HYDRALAZINE HCL 20 MG/ML IJ SOLN: 10.0000 mg | INTRAMUSCULAR | Status: DC | PRN

## 2020-10-17 MED ORDER — ENOXAPARIN SODIUM 40 MG/0.4ML ~~LOC~~ SOLN
40.0000 mg | Freq: Every day | SUBCUTANEOUS | Status: DC
  Administered 2020-10-17: 40 mg via SUBCUTANEOUS
  Filled 2020-10-17: qty 0.4

## 2020-10-17 NOTE — H&P (Signed)
History and Physical    RAYNOR CALCATERRA RJJ:[id number] DOB: [birth year] DOA: 10/17/2020  PCP: Velna Hatchet, MD  Patient coming from: Home  I have personally briefly reviewed patient's old medical records in [place]  Chief Complaint: CP  HPI: Anthony Adkins is a 79 y.o. male with medical history significant of DM2, HLD, never been diagnosed with HTN previously.  Pt presents to ED with c/o chest "tightness".  Symptoms onset around 11 AM.  HR at home was 40s-50s.  Associated SOB.  Got NTG with EMS which significantly improved symptoms though they are still persistent somewhat at this time.  Symptoms were worst during first 2 hours.   ED Course: Trop flat at 25 and 26  Review of Systems: As per HPI, otherwise all review of systems negative.  Past Medical History:  Diagnosis Date  . Diabetes mellitus without complication ([place])   . Heart murmur    had rheumatic fever as a child  . Hyperlipidemia     Past Surgical History:  Procedure Laterality Date  . COLONOSCOPY  2009  . INGUINAL HERNIA REPAIR     left side/over 20 years ago  . lymph nodes     removed left side neck due to cat scratch fever     reports that he has been smoking cigarettes. He has never used smokeless tobacco. He reports current alcohol use of about 7.0 - 10.0 standard drinks of alcohol per week. He reports previous drug use.  Allergies  Allergen Reactions  . Sulfa Antibiotics Anaphylaxis, Shortness Of Breath, Rash and Other (See Comments)    Throat and the neck both swell    Family History  Problem Relation Age of Onset  . Hypertension Father   . Heart disease Father   . Colon cancer Neg Hx   . Esophageal cancer Neg Hx   . Rectal cancer Neg Hx   . Stomach cancer Neg Hx      Prior to Admission medications   Medication Sig Start Date End Date Taking? Authorizing Provider  BAYER LOW DOSE 81 MG EC tablet Take 81 mg by mouth at bedtime. Swallow whole.   Yes [provider]   fluticasone (FLONASE) 50 MCG/ACT nasal spray Place 2 sprays into both nostrils daily as needed for rhinitis (or seasonal allergies). 09/24/20  Yes [provider]  ibuprofen (ADVIL) 200 MG tablet Take 200 mg by mouth every 6 (six) hours as needed for headache or mild pain.   Yes [provider]  loratadine (CLARITIN) 10 MG tablet Take 10 mg by mouth daily as needed for rhinitis (or seasonal allergies).   Yes [provider]  meloxicam (MOBIC) 15 MG tablet Take 1 tablet (15 mg total) by mouth daily. Patient taking differently: Take 15 mg by mouth daily as needed (for foot pain). 12/13/19  Yes Hyatt, Max T, DPM  METFORMIN HCL PO Take 1 tablet by mouth at bedtime.   Yes [provider]  Multiple Vitamins-Minerals (ONE-A-DAY MENS 50+ ADVANTAGE) TABS Take 1 tablet by mouth daily with breakfast.   Yes [provider]  NON FORMULARY Take 900 mg by mouth See admin instructions. CBD 900 mg gummie- Chew 1 gummie (900 mg) by mouth at bedtime   Yes [provider]    Physical Exam: Vitals:   10/17/20 2054 10/17/20 2115 10/17/20 2125 10/17/20 2145  BP: 137/82 (!) 169/98  (!) 159/87  Pulse: 88 (!) 58  71  Resp: 16 20  18   Temp:  TempSrc:      SpO2: 100% 96%  96%  Weight:   78.5 kg   Height:   5\' 9"  (1.753 m)     Constitutional: NAD, calm, comfortable Eyes: PERRL, lids and conjunctivae normal ENMT: Mucous membranes are moist. Posterior pharynx clear of any exudate or lesions.Normal dentition.  Neck: normal, supple, no masses, no thyromegaly Respiratory: clear to auscultation bilaterally, no wheezing, no crackles. Normal respiratory effort. No accessory muscle use.  Cardiovascular: Regular rate and rhythm, no murmurs / rubs / gallops. No extremity edema. 2+ pedal pulses. No carotid bruits.  Abdomen: no tenderness, no masses palpated. No hepatosplenomegaly. Bowel sounds positive.  Musculoskeletal: no clubbing / cyanosis. No joint deformity upper  and lower extremities. Good ROM, no contractures. Normal muscle tone.  Skin: no rashes, lesions, ulcers. No induration Neurologic: CN 2-12 grossly intact. Sensation intact, DTR normal. Strength 5/5 in all 4.  Psychiatric: Normal judgment and insight. Alert and oriented x 3. Normal mood.    Labs on Admission: I have personally reviewed following labs and imaging studies  CBC: Recent Labs  Lab 10/17/20 1616  WBC 11.7*  HGB 14.2  HCT 42.3  MCV 97.7  PLT 175   Basic Metabolic Panel: Recent Labs  Lab 10/17/20 1616  NA 138  K 4.1  CL 103  CO2 25  GLUCOSE 129*  BUN 11  CREATININE 0.94  CALCIUM 8.9   GFR: Estimated Creatinine Clearance: 63.7 mL/min (by C-G formula based on SCr of 0.94 mg/dL). Liver Function Tests: No results for input(s): AST, ALT, ALKPHOS, BILITOT, PROT, ALBUMIN in the last 168 hours. No results for input(s): LIPASE, AMYLASE in the last 168 hours. No results for input(s): AMMONIA in the last 168 hours. Coagulation Profile: No results for input(s): INR, PROTIME in the last 168 hours. Cardiac Enzymes: No results for input(s): CKTOTAL, CKMB, CKMBINDEX, TROPONINI in the last 168 hours. BNP (last 3 results) No results for input(s): PROBNP in the last 8760 hours. HbA1C: No results for input(s): HGBA1C in the last 72 hours. CBG: No results for input(s): GLUCAP in the last 168 hours. Lipid Profile: No results for input(s): CHOL, HDL, LDLCALC, TRIG, CHOLHDL, LDLDIRECT in the last 72 hours. Thyroid Function Tests: No results for input(s): TSH, T4TOTAL, FREET4, T3FREE, THYROIDAB in the last 72 hours. Anemia Panel: No results for input(s): VITAMINB12, FOLATE, FERRITIN, TIBC, IRON, RETICCTPCT in the last 72 hours. Urine analysis: No results found for: COLORURINE, APPEARANCEUR, LABSPEC, [place], GLUCOSEU, HGBUR, BILIRUBINUR, KETONESUR, PROTEINUR, UROBILINOGEN, NITRITE, LEUKOCYTESUR  Radiological Exams on Admission: DG Chest 2 View  Result Date:  10/17/2020 CLINICAL DATA:  Chest pain EXAM: CHEST - 2 VIEW COMPARISON:  None. FINDINGS: Cardiac shadow is at the upper limits of normal in size. Aortic calcifications are noted. Lungs are well aerated without focal infiltrate or effusion. Degenerative change thoracic spine is noted. IMPRESSION: No acute abnormality noted. Electronically Signed   By: Inez Catalina M.D.   On: 10/17/2020 16:36    EKG: Independently reviewed.  RBBB, LAFB, and frequent PVCs  Assessment/Plan Principal Problem:   Chest pain, rule out acute myocardial infarction Active Problems:   DM2 (diabetes mellitus, type 2) (HCC)   Elevated blood pressure reading    1. CP r/o - 1. CP obs pathway 2. Serial trops 3. Tele monitor 4. NPO after MN 5. Got ASA and NTG x1 with EMS 6. Call Dr. Einar Gip in AM - EDP saw note but wanted obs admit anyhow. 2. DM2 - 1. Hold metformin 2. Sensitive SSI  Q4H 3. Elevated BP reading - 1. No formal HTN diagnosis at baseline 2. Putting pt on PRN hydralazine  DVT prophylaxis: Lovenox Code Status: Full Family Communication: No family in room Disposition Plan: Home after cleared by cardiology Consults called: EDP consulted Dr. Jacinto Halim Admission status: Place in obs   Gisela Lea M. DO [facility]  How to contact the [facility] Attending or Consulting provider 7A - 7P or covering provider during after hours 7P -7A, for this patient?  1. Check the care team in [facility] and look for a) attending/consulting [facility] provider listed and b) the [facility] team listed 2. Log into [email or web address]  Amion Physician Scheduling and messaging for groups and whole hospitals  On call and physician scheduling software for group practices, residents, hospitalists and other medical providers for call, clinic, rotation and shift schedules. On[employer] is a hospital-wide system for scheduling doctors and paging doctors on call. EasyPlot is for scientific plotting and data analysis.  [email or web address]  and use [place]'s  universal password to access. If you do not have the password, please contact the hospital operator.  3. Locate the [facility] provider you are looking for under [facility] and page to a number that you can be directly reached. 4. If you still have difficulty reaching the provider, please page the [facility] (Director on Call) for the Hospitalists listed on amion for assistance.  10/17/2020, 10:15 PM

## 2020-10-17 NOTE — ED Triage Notes (Signed)
Pt BIB [facility] from and urgent care, pt reports chest tightness, shortness of breath and dizziness that started at 11am today. Per EMS, pt with frequent PVCs, given 1NTG and 324mg  asa with some relief in chest tightness.

## 2020-10-17 NOTE — ED Provider Notes (Signed)
[place] EMERGENCY DEPARTMENT Provider Note   CSN: [id number] Arrival date & time: 10/17/20  1610     History Chief Complaint  Patient presents with  . Chest Pain    Anthony Adkins is a 79 y.o. male.  79 year old male with prior medical history as above presents for evaluation of chest discomfort.  Patient reports onset of chest pain around 11 AM.  He reports noted his heart rate was noted to the 40s to 50s.  He had associated chest tightness and shortness of breath.  Symptoms are significant improved now.  He reports that his symptoms were most severe for the first 2 hours.    The history is provided by the patient and medical records.  Chest Pain Pain location:  Substernal area Pain quality: aching   Pain radiates to:  Does not radiate Pain severity:  Mild Onset quality:  Sudden Duration:  9 hours Timing:  Constant Progression:  Resolved Chronicity:  New      Past Medical History:  Diagnosis Date  . Diabetes mellitus without complication ([place])   . Heart murmur    had rheumatic fever as a child  . Hyperlipidemia     There are no problems to display for this patient.   Past Surgical History:  Procedure Laterality Date  . COLONOSCOPY  2009  . INGUINAL HERNIA REPAIR     left side/over 20 years ago  . lymph nodes     removed left side neck due to cat scratch fever       Family History  Problem Relation Age of Onset  . Hypertension Father   . Heart disease Father   . Colon cancer Neg Hx   . Esophageal cancer Neg Hx   . Rectal cancer Neg Hx   . Stomach cancer Neg Hx     Social History   Tobacco Use  . Smoking status: Light Tobacco Smoker    Types: Cigarettes  . Smokeless tobacco: Never Used  . Tobacco comment: Smokes around 5 cigarettes a day  Vaping Use  . Vaping Use: Never used  Substance Use Topics  . Alcohol use: Yes    Alcohol/week: 7.0 - 10.0 standard drinks    Types: 7 - 10 Standard drinks or equivalent per week   . Drug use: Not Currently    Home Medications Prior to Admission medications   Medication Sig Start Date End Date Taking? Authorizing Provider  BAYER LOW DOSE 81 MG EC tablet Take 81 mg by mouth at bedtime. Swallow whole.   Yes [provider]  fluticasone (FLONASE) 50 MCG/ACT nasal spray Place 2 sprays into both nostrils daily as needed for rhinitis (or seasonal allergies). 09/24/20  Yes [provider]  ibuprofen (ADVIL) 200 MG tablet Take 200 mg by mouth every 6 (six) hours as needed for headache or mild pain.   Yes [provider]  loratadine (CLARITIN) 10 MG tablet Take 10 mg by mouth daily as needed for rhinitis (or seasonal allergies).   Yes [provider]  meloxicam (MOBIC) 15 MG tablet Take 1 tablet (15 mg total) by mouth daily. Patient taking differently: Take 15 mg by mouth daily as needed (for foot pain). 12/13/19  Yes Hyatt, Max T, DPM  Multiple Vitamins-Minerals (ONE-A-DAY MENS 50+ ADVANTAGE) TABS Take 1 tablet by mouth daily with breakfast.   Yes [provider]  NON FORMULARY Take 900 mg by mouth See admin instructions. CBD 900 mg gummie- Chew 1 gummie (900 mg) by  mouth at bedtime   Yes [provider]  metFORMIN (GLUCOPHAGE) 500 MG tablet Take by mouth 2 (two) times daily with a meal. Pt states unknown dosage    [provider]    Allergies    Sulfa antibiotics and Sulfur  Review of Systems   Review of Systems  Cardiovascular: Positive for chest pain.  All other systems reviewed and are negative.   Physical Exam Updated Vital Signs BP (!) 159/87   Pulse 71   Temp 97.6 F (36.4 C) (Oral)   Resp 18   Ht 5\' 9"  (1.753 m)   Wt 78.5 kg   SpO2 96%   BMI 25.55 kg/m   Physical Exam Vitals and nursing note reviewed.  Constitutional:      General: He is not in acute distress.    Appearance: He is well-developed and well-nourished.  HENT:     Head: Normocephalic and atraumatic.     Mouth/Throat:      Mouth: Oropharynx is clear and moist.  Eyes:     Extraocular Movements: EOM normal.     Conjunctiva/sclera: Conjunctivae normal.     Pupils: Pupils are equal, round, and reactive to light.  Cardiovascular:     Rate and Rhythm: Normal rate and regular rhythm.     Heart sounds: Normal heart sounds.  Pulmonary:     Effort: Pulmonary effort is normal. No respiratory distress.     Breath sounds: Normal breath sounds.  Abdominal:     General: There is no distension.     Palpations: Abdomen is soft.     Tenderness: There is no abdominal tenderness.  Musculoskeletal:        General: No deformity or edema. Normal range of motion.     Cervical back: Normal range of motion and neck supple.  Skin:    General: Skin is warm and dry.  Neurological:     General: No focal deficit present.     Mental Status: He is alert and oriented to person, place, and time.  Psychiatric:        Mood and Affect: Mood and affect normal.     ED Results / Procedures / Treatments   Labs (all labs ordered are listed, but only abnormal results are displayed) Labs Reviewed  BASIC METABOLIC PANEL - Abnormal; Notable for the following components:      Result Value   Glucose, Bld 129 (*)    All other components within normal limits  CBC - Abnormal; Notable for the following components:   WBC 11.7 (*)    All other components within normal limits  TROPONIN I (HIGH SENSITIVITY) - Abnormal; Notable for the following components:   Troponin I (High Sensitivity) 25 (*)    All other components within normal limits  TROPONIN I (HIGH SENSITIVITY) - Abnormal; Notable for the following components:   Troponin I (High Sensitivity) 26 (*)    All other components within normal limits  RESP PANEL BY RT-PCR (FLU A&B, COVID) [id number]     EKG Interpretation  Date/Time:  Wednesday October 17 2020 16:14:08 EST Ventricular Rate:  81 PR Interval:  154 QRS Duration: 142 QT Interval:  406 QTC Calculation: 471 R Axis:   -71 Text  Interpretation: Sinus rhythm with frequent Premature ventricular complexes Right bundle branch block Left anterior fascicular block Abnormal ECG Confirmed by Dene Gentry [phone]) on 10/17/2020 8:46:09 PM  Radiology DG Chest 2 View  Result Date: 10/17/2020 CLINICAL DATA:  Chest pain EXAM: CHEST - 2 VIEW  COMPARISON:  None. FINDINGS: Cardiac shadow is at the upper limits of normal in size. Aortic calcifications are noted. Lungs are well aerated without focal infiltrate or effusion. Degenerative change thoracic spine is noted. IMPRESSION: No acute abnormality noted. Electronically Signed   By: Inez Catalina M.D.   On: 10/17/2020 16:36    Procedures Procedures (including critical care time)  Medications Ordered in ED Medications - No data to display  ED Course  I have reviewed the triage vital signs and the nursing notes.  Pertinent labs & imaging results that were available during my care of the patient were reviewed by me and considered in my medical decision making (see chart for details).    MDM Rules/Calculators/A&P                          MDM  Screen complete  Anthony Adkins was evaluated in Emergency Department on 10/17/2020 for the symptoms described in the history of present illness. He was evaluated in the context of the global COVID-19 pandemic, which necessitated consideration that the patient might be at risk for infection with the SARS-CoV-2 virus that causes COVID-19. Institutional protocols and algorithms that pertain to the evaluation of patients at risk for COVID-19 are in a state of rapid change based on information released by regulatory bodies including the CDC and federal and state organizations. These policies and algorithms were followed during the patient's care in the ED.  Presenting for evaluation of reported chest discomfort with reported bradycardia  Symptoms are resolved at time of ED evaluation  Patient without prior history of ACS or prior cardiac  work-up.  Patient would likely benefit from overnight observation and further inpatient evaluation.  Hospitalist service is aware of case and will evaluate for admission.   Final Clinical Impression(s) / ED Diagnoses Final diagnoses:  Chest pain, unspecified type    Rx / DC Orders ED Discharge Orders    None       Valarie Merino, MD 10/17/20 2251

## 2020-10-17 NOTE — Progress Notes (Signed)
I have reviewed his EKG and labs. No acute ischemic changes and trops flat. Repeat EKG and if no change, and ED physician feels safe to discharge, I can follow up as OP.   Adrian Prows, MD, [facility] 10/17/2020, 9:17 PM Office: [phone] Pager: [phone]  C: [phone]

## 2020-10-17 NOTE — Progress Notes (Signed)
Formatting of this note is different from the original.    Subjective Patient ID: Cory Robertson is a 79 y.o. male with a past medical history of diabetes presenting to clinic for treatment and evaluation of new onset chest pain with shortness of breath that started at 1130 this morning.  Patient denies any significant cardiac history.  He denies any pain like this prior.  He states that the pain has been constant since this morning.  He has not tried anything to make it better or worse.  He denies any fever, chills, headache, neck pain or rigidity, URI symptoms, cough, current shortness of breath, abdominal pain, nausea, vomiting, diarrhea, rashes, pain or burning with urination, diaphoresis, drooping of facial muscles, weakness or paralysis to any of his extremities.  Patient denies any prior MI or TIA or stroke.  Patient states he did try to check his blood sugar this morning but his meter would not work.  He states that he ate a piece of candy and a barbecue sandwich to see if this would improve his symptoms.    The following portions of the patient's history were reviewed and updated as appropriate: allergies, current medications, past family history, past medical history, past social history, past surgical history and problem list.    Review of Systems   Constitutional: Negative.    HENT: Negative.    Respiratory: Positive for chest tightness and shortness of breath. Negative for cough and wheezing.    Cardiovascular: Positive for chest pain. Negative for palpitations and leg swelling.   Gastrointestinal: Negative.    Genitourinary: Negative.    Musculoskeletal: Negative.    Skin: Negative.    Neurological: Negative.          Objective    Physical Exam  Vitals and nursing note reviewed.   Constitutional:       General: He is not in acute distress.     Appearance: Normal appearance. He is not ill-appearing, toxic-appearing or diaphoretic.   HENT:      Head: Normocephalic and atraumatic.      Right Ear:  External ear normal.      Left Ear: External ear normal.      Nose: Nose normal.   Eyes:      Extraocular Movements: Extraocular movements intact.      Conjunctiva/sclera: Conjunctivae normal.      Pupils: Pupils are equal, round, and reactive to light.   Cardiovascular:      Rate and Rhythm: Normal rate and regular rhythm.      Pulses: Normal pulses.      Heart sounds: Normal heart sounds.   Pulmonary:      Effort: Pulmonary effort is normal.      Breath sounds: Normal breath sounds.   Musculoskeletal:         General: Normal range of motion.      Cervical back: Normal range of motion and neck supple.   Skin:     General: Skin is warm and dry.      Capillary Refill: Capillary refill takes less than 2 seconds.      Findings: No erythema or rash.   Neurological:      General: No focal deficit present.      Mental Status: He is alert and oriented to person, place, and time.      Motor: No weakness.   Psychiatric:         Mood and Affect: Mood normal.  Behavior: Behavior normal.       Assessment    Encounter Diagnoses   Name Primary?   ? Shortness of breath Yes   ? Chest pain, unspecified type    EKG: there are no previous tracings available for comparison, RBBB, PVCs with bigeminy. No ST elevation.      Plan    During this patient encounter, the patient was wearing a mask.    Throughout this encounter, I was wearing at least a surgical mask.  I was not within 6 feet of this patient for more than 15 minutes without eye protection when they were not wearing mask.   Urgent care management:     On arrival, patient is hemodynamically stable and afebrile. Prior medical records reviewed.   Based on initial evaluation and given the limitation of laboratory/diagnostic studies in the urgent care setting, plan is to refer patient to the emergency department to be further evaluate.     We discussed the potential diagnosis and risks. Patient shows understanding of instructions, is agreeable, and feels comfortable with the  referral to the emergency department plan.    EMS was dispatched.  Patient left in stable condition to [facility], ED via EMS.        Electronically signed by: Garen Lah, PA-C 10/17/2020 3:20 PM      Electronically signed by: Garen Lah, PA-C  10/17/20 1536    Electronically signed by Garen Lah, PA-C at 10/17/2020  3:36 PM EST

## 2020-10-18 ENCOUNTER — Encounter (HOSPITAL_COMMUNITY)
Admission: EM | Disposition: A | Discharge status: Home / Self Care | Payer: Self-pay | Source: Home / Self Care | Attending: Emergency Medicine

## 2020-10-18 ENCOUNTER — Observation Stay (HOSPITAL_COMMUNITY): Payer: Medicare HMO

## 2020-10-18 ENCOUNTER — Ambulatory Visit (HOSPITAL_COMMUNITY): Admit: 2020-10-18 | Payer: Medicare HMO | Admitting: Cardiology

## 2020-10-18 ENCOUNTER — Other Ambulatory Visit (HOSPITAL_COMMUNITY): Payer: Self-pay | Admitting: Cardiology

## 2020-10-18 DIAGNOSIS — E119 Type 2 diabetes mellitus without complications: Secondary | ICD-10-CM | POA: Diagnosis not present

## 2020-10-18 DIAGNOSIS — E785 Hyperlipidemia, unspecified: Secondary | ICD-10-CM | POA: Diagnosis not present

## 2020-10-18 DIAGNOSIS — I10 Essential (primary) hypertension: Secondary | ICD-10-CM

## 2020-10-18 DIAGNOSIS — I214 Non-ST elevation (NSTEMI) myocardial infarction: Secondary | ICD-10-CM | POA: Diagnosis not present

## 2020-10-18 DIAGNOSIS — R69 Illness, unspecified: Secondary | ICD-10-CM | POA: Diagnosis not present

## 2020-10-18 DIAGNOSIS — I452 Bifascicular block: Secondary | ICD-10-CM | POA: Diagnosis not present

## 2020-10-18 DIAGNOSIS — Z20822 Contact with and (suspected) exposure to covid-19: Secondary | ICD-10-CM | POA: Diagnosis not present

## 2020-10-18 DIAGNOSIS — I493 Ventricular premature depolarization: Secondary | ICD-10-CM | POA: Diagnosis not present

## 2020-10-18 DIAGNOSIS — I25110 Atherosclerotic heart disease of native coronary artery with unstable angina pectoris: Secondary | ICD-10-CM | POA: Diagnosis not present

## 2020-10-18 DIAGNOSIS — F17210 Nicotine dependence, cigarettes, uncomplicated: Secondary | ICD-10-CM | POA: Diagnosis not present

## 2020-10-18 DIAGNOSIS — Z882 Allergy status to sulfonamides status: Secondary | ICD-10-CM | POA: Diagnosis not present

## 2020-10-18 DIAGNOSIS — I200 Unstable angina: Secondary | ICD-10-CM | POA: Diagnosis not present

## 2020-10-18 DIAGNOSIS — Z7984 Long term (current) use of oral hypoglycemic drugs: Secondary | ICD-10-CM | POA: Diagnosis not present

## 2020-10-18 DIAGNOSIS — I2510 Atherosclerotic heart disease of native coronary artery without angina pectoris: Secondary | ICD-10-CM

## 2020-10-18 DIAGNOSIS — I517 Cardiomegaly: Secondary | ICD-10-CM | POA: Diagnosis not present

## 2020-10-18 DIAGNOSIS — Z8249 Family history of ischemic heart disease and other diseases of the circulatory system: Secondary | ICD-10-CM | POA: Diagnosis not present

## 2020-10-18 HISTORY — PX: CORONARY STENT INTERVENTION: CATH118234

## 2020-10-18 HISTORY — PX: LEFT HEART CATH AND CORONARY ANGIOGRAPHY: CATH118249

## 2020-10-18 LAB — TROPONIN I (HIGH SENSITIVITY)
Troponin I (High Sensitivity): 26 ng/L — ABNORMAL HIGH (ref <18)
Troponin I (High Sensitivity): 28 ng/L — ABNORMAL HIGH (ref <18)

## 2020-10-18 LAB — GLUCOSE, CAPILLARY: Glucose-Capillary: 113 mg/dL — ABNORMAL HIGH (ref 70–99)

## 2020-10-18 LAB — LIPID PANEL
Cholesterol: 116 mg/dL (ref 0–200)
HDL: 43 mg/dL (ref >40)
LDL Cholesterol: 62 mg/dL (ref 0–99)
Total CHOL/HDL Ratio: 2.7 RATIO
Triglycerides: 57 mg/dL (ref <150)
VLDL: 11 mg/dL (ref 0–40)

## 2020-10-18 LAB — CBG MONITORING, ED
Glucose-Capillary: 96 mg/dL (ref 70–99)
Glucose-Capillary: 130 mg/dL — ABNORMAL HIGH (ref 70–99)

## 2020-10-18 LAB — POCT ACTIVATED CLOTTING TIME: Activated Clotting Time: 303 seconds

## 2020-10-18 SURGERY — LEFT HEART CATH AND CORONARY ANGIOGRAPHY
Anesthesia: LOCAL

## 2020-10-18 MED ORDER — NITROGLYCERIN 0.4 MG SL SUBL: 0.4000 mg | SUBLINGUAL_TABLET | SUBLINGUAL | 3 refills | Status: DC | PRN

## 2020-10-18 MED ORDER — HEPARIN (PORCINE) IN NACL 1000-0.9 UT/500ML-% IV SOLN
INTRAVENOUS | Status: AC
  Filled 2020-10-18: qty 1000

## 2020-10-18 MED ORDER — LIDOCAINE HCL (PF) 1 % IJ SOLN
INTRAMUSCULAR | Status: DC | PRN
  Administered 2020-10-18: 2 mL

## 2020-10-18 MED ORDER — SODIUM CHLORIDE 0.9 % WEIGHT BASED INFUSION
1.0000 mL/kg/h | INTRAVENOUS | Status: DC
  Administered 2020-10-18: 1 mL/kg/h via INTRAVENOUS

## 2020-10-18 MED ORDER — NITROGLYCERIN 1 MG/10 ML FOR IR/CATH LAB
INTRA_ARTERIAL | Status: AC
  Filled 2020-10-18: qty 10

## 2020-10-18 MED ORDER — HEPARIN SODIUM (PORCINE) 1000 UNIT/ML IJ SOLN
INTRAMUSCULAR | Status: AC
  Filled 2020-10-18: qty 1

## 2020-10-18 MED ORDER — SODIUM CHLORIDE 0.9 % WEIGHT BASED INFUSION: 1.0000 mL/kg/h | INTRAVENOUS | Status: DC

## 2020-10-18 MED ORDER — FENTANYL CITRATE (PF) 100 MCG/2ML IJ SOLN
INTRAMUSCULAR | Status: AC
  Filled 2020-10-18: qty 2

## 2020-10-18 MED ORDER — MIDAZOLAM HCL 2 MG/2ML IJ SOLN
INTRAMUSCULAR | Status: AC
  Filled 2020-10-18: qty 2

## 2020-10-18 MED ORDER — SODIUM CHLORIDE 0.9 % WEIGHT BASED INFUSION: 3.0000 mL/kg/h | INTRAVENOUS | Status: DC

## 2020-10-18 MED ORDER — ATORVASTATIN CALCIUM 80 MG PO TABS: 80.0000 mg | ORAL_TABLET | Freq: Every day | ORAL | 0 refills | Status: DC

## 2020-10-18 MED ORDER — TICAGRELOR 90 MG PO TABS: 90.0000 mg | ORAL_TABLET | Freq: Two times a day (BID) | ORAL | 2 refills | Status: DC

## 2020-10-18 MED ORDER — TICAGRELOR 90 MG PO TABS: 90.0000 mg | ORAL_TABLET | Freq: Two times a day (BID) | ORAL | Status: DC

## 2020-10-18 MED ORDER — TICAGRELOR 90 MG PO TABS
ORAL_TABLET | ORAL | Status: DC | PRN
  Administered 2020-10-18: 180 mg via ORAL

## 2020-10-18 MED ORDER — ASPIRIN 81 MG PO CHEW: 81.0000 mg | CHEWABLE_TABLET | ORAL | Status: DC

## 2020-10-18 MED ORDER — MIDAZOLAM HCL 2 MG/2ML IJ SOLN
INTRAMUSCULAR | Status: DC | PRN
  Administered 2020-10-18: 2 mg via INTRAVENOUS

## 2020-10-18 MED ORDER — VERAPAMIL HCL 2.5 MG/ML IV SOLN
INTRAVENOUS | Status: AC
  Filled 2020-10-18: qty 2

## 2020-10-18 MED ORDER — ONDANSETRON HCL 4 MG/2ML IJ SOLN: 4.0000 mg | Freq: Four times a day (QID) | INTRAMUSCULAR | Status: DC | PRN

## 2020-10-18 MED ORDER — LABETALOL HCL 5 MG/ML IV SOLN
10.0000 mg | INTRAVENOUS | Status: DC | PRN
  Filled 2020-10-18: qty 4

## 2020-10-18 MED ORDER — SODIUM CHLORIDE 0.9% FLUSH: 3.0000 mL | Freq: Two times a day (BID) | INTRAVENOUS | Status: DC

## 2020-10-18 MED ORDER — ACETAMINOPHEN 325 MG PO TABS: 650.0000 mg | ORAL_TABLET | ORAL | Status: DC | PRN

## 2020-10-18 MED ORDER — SODIUM CHLORIDE 0.9 % IV SOLN: 250.0000 mL | INTRAVENOUS | Status: DC | PRN

## 2020-10-18 MED ORDER — VERAPAMIL HCL 2.5 MG/ML IV SOLN
INTRA_ARTERIAL | Status: DC | PRN
  Administered 2020-10-18: 7.5 mL via INTRA_ARTERIAL

## 2020-10-18 MED ORDER — IOHEXOL 350 MG/ML SOLN
INTRAVENOUS | Status: DC | PRN
  Administered 2020-10-18: 150 mL via INTRA_ARTERIAL

## 2020-10-18 MED ORDER — NITROGLYCERIN 1 MG/10 ML FOR IR/CATH LAB
INTRA_ARTERIAL | Status: DC | PRN
  Administered 2020-10-18: 200 ug via INTRACORONARY

## 2020-10-18 MED ORDER — SODIUM CHLORIDE 0.9 % WEIGHT BASED INFUSION: 3.0000 mL/kg/h | INTRAVENOUS | Status: AC

## 2020-10-18 MED ORDER — TICAGRELOR 90 MG PO TABS
ORAL_TABLET | ORAL | Status: AC
  Filled 2020-10-18: qty 2

## 2020-10-18 MED ORDER — HEPARIN SODIUM (PORCINE) 1000 UNIT/ML IJ SOLN
INTRAMUSCULAR | Status: DC | PRN
  Administered 2020-10-18: 3000 [IU] via INTRAVENOUS
  Administered 2020-10-18: 5000 [IU] via INTRAVENOUS

## 2020-10-18 MED ORDER — METOPROLOL SUCCINATE ER 50 MG PO TB24: 50.0000 mg | ORAL_TABLET | Freq: Every day | ORAL | 2 refills | Status: DC

## 2020-10-18 MED ORDER — METFORMIN HCL 500 MG PO TABS: 500.0000 mg | ORAL_TABLET | Freq: Every day | ORAL | Status: DC

## 2020-10-18 MED ORDER — HEPARIN (PORCINE) IN NACL 1000-0.9 UT/500ML-% IV SOLN
INTRAVENOUS | Status: DC | PRN
  Administered 2020-10-18 (×2): 500 mL

## 2020-10-18 MED ORDER — LIDOCAINE HCL (PF) 1 % IJ SOLN
INTRAMUSCULAR | Status: AC
  Filled 2020-10-18: qty 30

## 2020-10-18 MED ORDER — SODIUM CHLORIDE 0.9% FLUSH: 3.0000 mL | INTRAVENOUS | Status: DC | PRN

## 2020-10-18 MED ORDER — ASPIRIN 81 MG PO CHEW: 81.0000 mg | CHEWABLE_TABLET | Freq: Every day | ORAL | Status: DC

## 2020-10-18 MED ORDER — FENTANYL CITRATE (PF) 100 MCG/2ML IJ SOLN
INTRAMUSCULAR | Status: DC | PRN
  Administered 2020-10-18: 25 ug via INTRAVENOUS

## 2020-10-18 MED FILL — NITROGLYCERIN 0.4 MG TAB SL: 0.4 | 7 days supply | Qty: 25 | Fill #0

## 2020-10-18 MED FILL — METOPROLOL SUCCINATE ER 50: 50 | 30 days supply | Qty: 30 | Fill #0

## 2020-10-18 MED FILL — ATORVASTATIN CALCIUM 80 MG: 80 | 30 days supply | Qty: 30 | Fill #0

## 2020-10-18 MED FILL — BRILINTA 90 MG TABLET: 90 | 30 days supply | Qty: 60 | Fill #0

## 2020-10-18 SURGICAL SUPPLY — 17 items
BALLN WOLVERINE 3.00X10 (BALLOONS) ×2
BALLN ~~LOC~~ EMERGE MR 3.75X15 (BALLOONS) ×2
BALLOON WOLVERINE 3.00X10 (BALLOONS) IMPLANT
BALLOON ~~LOC~~ EMERGE MR 3.75X15 (BALLOONS) IMPLANT
CATH LAUNCHER 6FR EBU3.5 (CATHETERS) ×1 IMPLANT
CATH OPTITORQUE TIG 4.0 5F (CATHETERS) ×1 IMPLANT
DEVICE RAD COMP TR BAND LRG (VASCULAR PRODUCTS) ×1 IMPLANT
GLIDESHEATH SLEND A-KIT 6F 22G (SHEATH) ×1 IMPLANT
GUIDEWIRE INQWIRE 1.5J.035X260 (WIRE) IMPLANT
INQWIRE 1.5J .035X260CM (WIRE) ×2
KIT ENCORE 26 ADVANTAGE (KITS) ×1 IMPLANT
KIT HEART LEFT (KITS) ×2 IMPLANT
PACK CARDIAC CATHETERIZATION (CUSTOM PROCEDURE TRAY) ×2 IMPLANT
STENT RESOLUTE ONYX 3.5X26 (Permanent Stent) ×1 IMPLANT
TRANSDUCER W/STOPCOCK (MISCELLANEOUS) ×2 IMPLANT
TUBING CIL FLEX 10 FLL-RA (TUBING) ×2 IMPLANT
WIRE COUGAR XT STRL 190CM (WIRE) ×1 IMPLANT

## 2020-10-18 NOTE — H&P (View-Only) (Signed)
CARDIOLOGY CONSULT NOTE  Patient ID: Anthony Adkins MRN: [id number] DOB/AGE: 79/22/1942 79 y.o.  Admit date: 10/17/2020 Referring Physician  Pahwani, Rinka R, MD Primary Physician:  Holwerda, Scott, MD Reason for Consultation: Chest pain   Patient ID: Anthony Adkins, male    DOB: 03/05/1941, 79 y.o.   MRN: [id number]  Chief Complaint  Patient presents with  . Chest Pain   HPI:    Anthony Adkins  is a 79 y.o. male with history of tobacco use, type 2 diabetes mellitus, rheumatic fever as a child, and hyperlipidemia. He has not previously been diagnosed with hypertension. Denies history of MI, TIA/CVA.   Patient presents to the [facility] emergency department yesterday after he was seen in urgent care for chest pain.  Patient reports at 11:30 AM he was driving and started to feel "chest tightness" in the middle of his chest 5/10 severity.  Reports chest tightness was associated with shortness of breath.  Patient's chest discomfort continued intermittently throughout the day, and therefore he presented to urgent care for evaluation around 2 PM.  At urgent care an EKG was reportedly done that was concerning and he was therefore transported to [facility] emergency department.  Patient was given sublingual nitroglycerin by EMS during transport, which reportedly significantly improved his symptoms.  Upon presentation in the emergency department patient's chest discomfort had significantly improved.  Work-up in the emergency department revealed sinus rhythm with premature ventricular contractions and bifascicular block, as well as mildly elevated, but flat troponin x4.   Patient is presently chest pain-free.  Reports he last experienced chest discomfort around 11 PM last night.  Presently denies shortness of breath, dizziness, nausea, vomiting, leg swelling.  Past Medical History:  Diagnosis Date  . Diabetes mellitus without complication (HCC)   . Heart murmur    had rheumatic fever as a child  .  Hyperlipidemia    Past Surgical History:  Procedure Laterality Date  . COLONOSCOPY  2009  . INGUINAL HERNIA REPAIR     left side/over 20 years ago  . lymph nodes     removed left side neck due to cat scratch fever   Family History  Problem Relation Age of Onset  . Hypertension Father   . Heart disease Father   . Colon cancer Neg Hx   . Esophageal cancer Neg Hx   . Rectal cancer Neg Hx   . Stomach cancer Neg Hx    Social History   Tobacco Use  . Smoking status: Light Tobacco Smoker    Types: Cigarettes  . Smokeless tobacco: Never Used  . Tobacco comment: Smokes around 5 cigarettes a day  Substance Use Topics  . Alcohol use: Yes    Alcohol/week: 7.0 - 10.0 standard drinks    Types: 7 - 10 Standard drinks or equivalent per week    Marital Sttus: Married  ROS  Review of Systems  Constitutional: Negative for diaphoresis and malaise/fatigue.  HENT: Negative.   Eyes: Negative.   Cardiovascular: Positive for chest pain. Negative for dyspnea on exertion, leg swelling, near-syncope, orthopnea, palpitations, paroxysmal nocturnal dyspnea and syncope.  Respiratory: Positive for shortness of breath. Negative for cough.   Endocrine: Negative.   Hematologic/Lymphatic: Negative for bleeding problem. Does not bruise/bleed easily.  Skin: Negative.   Musculoskeletal: Negative.   Gastrointestinal: Negative for abdominal pain, melena, nausea and vomiting.  Genitourinary: Negative.   Neurological: Negative for dizziness, focal weakness, headaches and weakness.  Psychiatric/Behavioral: Negative.   Allergic/Immunologic: Negative.      Objective   Vitals with BMI 10/18/2020 10/18/2020 10/18/2020  Height - - -  Weight - - -  BMI - - -  Systolic [id number]  Diastolic 73 64 69  Pulse 60 61 53    Blood pressure (!) 141/73, pulse 60, temperature 98.7 F (37.1 C), temperature source Oral, resp. rate 16, height 5\' 9"  (1.753 m), weight 78.5 kg, SpO2 97 %.    Physical Exam Vitals reviewed.   Constitutional:      General: He is not in acute distress.    Appearance: Normal appearance. He is normal weight. He is not ill-appearing or diaphoretic.  HENT:     Head: Normocephalic and atraumatic.     Nose: Nose normal.  Eyes:     Extraocular Movements: Extraocular movements intact.     Conjunctiva/sclera: Conjunctivae normal.  Neck:     Vascular: No carotid bruit.  Cardiovascular:     Rate and Rhythm: Normal rate and regular rhythm. Occasional extrasystoles are present.    Pulses: Intact distal pulses.          Carotid pulses are 2+ on the right side and 2+ on the left side with bruit.      Radial pulses are 2+ on the right side and 2+ on the left side.       Femoral pulses are 2+ on the right side and 2+ on the left side.      Popliteal pulses are 2+ on the right side and 2+ on the left side.       Dorsalis pedis pulses are 2+ on the right side and 2+ on the left side.       Posterior tibial pulses are 2+ on the right side and 2+ on the left side.     Heart sounds: S1 normal and S2 normal. Murmur heard.  High-pitched blowing holosystolic murmur is present with a grade of 2/6 at the apex.  Harsh midsystolic murmur of grade 2/6 is also present at the upper right sternal border.   Pulmonary:     Effort: Pulmonary effort is normal.     Breath sounds: No wheezing, rhonchi or rales.  Abdominal:     General: Bowel sounds are normal.     Palpations: Abdomen is soft.     Tenderness: There is no abdominal tenderness.  Musculoskeletal:     Cervical back: Normal range of motion and neck supple.     Right lower leg: No edema.     Left lower leg: No edema.  Skin:    General: Skin is warm and dry.     Capillary Refill: Capillary refill takes less than 2 seconds.  Neurological:     General: No focal deficit present.     Mental Status: He is alert and oriented to person, place, and time.  Psychiatric:        Mood and Affect: Mood normal.        Behavior: Behavior normal.     Laboratory examination:   Recent Labs    10/17/20 1616  NA 138  K 4.1  CL 103  CO2 25  GLUCOSE 129*  BUN 11  CREATININE 0.94  CALCIUM 8.9  GFRNONAA >60   estimated creatinine clearance is 63.7 mL/min (by C-G formula based on SCr of 0.94 mg/dL).  CMP Latest Ref Rng & Units 10/17/2020  Glucose 70 - 99 mg/dL 129(H)  BUN 8 - 23 mg/dL 11  Creatinine 0.61 - 1.24 mg/dL 0.94  Sodium 135 - 145  mmol/L 138  Potassium 3.5 - 5.1 mmol/L 4.1  Chloride 98 - 111 mmol/L 103  CO2 22 - 32 mmol/L 25  Calcium 8.9 - 10.3 mg/dL 8.9   CBC Latest Ref Rng & Units 10/17/2020  WBC 4.0 - 10.5 K/uL 11.7(H)  Hemoglobin 13.0 - 17.0 g/dL 14.2  Hematocrit 39.0 - 52.0 % 42.3  Platelets 150 - 400 K/uL 234   Lipid Panel Recent Labs    10/18/20 0118  CHOL 116  TRIG 57  LDLCALC 62  VLDL 11  HDL 43  CHOLHDL 2.7    HEMOGLOBIN A1C Lab Results  Component Value Date   HGBA1C 6.2 (H) 10/17/2020   MPG 131.24 10/17/2020   TSH No results for input(s): TSH in the last 8760 hours. BNP (last 3 results) No results for input(s): BNP in the last 8760 hours. Results for orders placed or performed during the hospital encounter of 10/17/20 (from the past 48 hour(s))  Basic metabolic panel     Status: Abnormal   Collection Time: 10/17/20  4:16 PM  Result Value Ref Range   Sodium 138 135 - 145 mmol/L   Potassium 4.1 3.5 - 5.1 mmol/L   Chloride 103 98 - 111 mmol/L   CO2 25 22 - 32 mmol/L   Glucose, Bld 129 (H) 70 - 99 mg/dL    Comment: Glucose reference range applies only to samples taken after fasting for at least 8 hours.   BUN 11 8 - 23 mg/dL   Creatinine, Ser 0.94 0.61 - 1.24 mg/dL   Calcium 8.9 8.9 - 10.3 mg/dL   GFR, Estimated >60 >60 mL/min    Comment: (NOTE) Calculated using the CKD-EPI Creatinine Equation (2021)    Anion gap 10 5 - 15    Comment: Performed at [facility] [address] [place], [place] [address]  CBC     Status: Abnormal   Collection Time: 10/17/20  4:16 PM   Result Value Ref Range   WBC 11.7 (H) 4.0 - 10.5 K/uL   RBC 4.33 4.22 - 5.81 MIL/uL   Hemoglobin 14.2 13.0 - 17.0 g/dL   HCT 42.3 39.0 - 52.0 %   MCV 97.7 80.0 - 100.0 fL   MCH 32.8 26.0 - 34.0 pg   MCHC 33.6 30.0 - 36.0 g/dL   RDW 12.1 11.5 - 15.5 %   Platelets 234 150 - 400 K/uL   nRBC 0.0 0.0 - 0.2 %    Comment: Performed at [facility], [place] [address], [place], [place] [address]  Troponin I (High Sensitivity)     Status: Abnormal   Collection Time: 10/17/20  4:16 PM  Result Value Ref Range   Troponin I (High Sensitivity) 25 (H) <18 ng/L    Comment: (NOTE) Elevated high sensitivity troponin I (hsTnI) values and significant  changes across serial measurements may suggest ACS but many other  chronic and acute conditions are known to elevate hsTnI results.  Refer to the "Links" section for chest pain algorithms and additional  guidance. Performed at [facility], [place] [address] [place], [place] [address]   Troponin I (High Sensitivity)     Status: Abnormal   Collection Time: 10/17/20  8:07 PM  Result Value Ref Range   Troponin I (High Sensitivity) 26 (H) <18 ng/L    Comment: (NOTE) Elevated high sensitivity troponin I (hsTnI) values and significant  changes across serial measurements may suggest ACS but many other  chronic and acute conditions are known  to elevate hsTnI results.  Refer to the "Links" section for chest pain algorithms and additional  guidance. Performed at [facility], [place] [address] [place], [place] [address]   Resp Panel by RT-PCR (Flu A&B, Covid) Nasopharyngeal Swab     Status: None   Collection Time: 10/17/20  9:24 PM   Specimen: Nasopharyngeal Swab; Nasopharyngeal(NP) swabs in vial transport medium  Result Value Ref Range   SARS Coronavirus 2 by RT PCR NEGATIVE NEGATIVE    Comment: (NOTE) SARS-CoV-2 target nucleic acids are NOT DETECTED.  The SARS-CoV-2 RNA is generally detectable in upper respiratory specimens during the  acute phase of infection. The lowest concentration of SARS-CoV-2 viral copies this assay can detect is 138 copies/mL. A negative result does not preclude SARS-Cov-2 infection and should not be used as the sole basis for treatment or other patient management decisions. A negative result may occur with  improper specimen collection/handling, submission of specimen other than nasopharyngeal swab, presence of viral mutation(s) within the areas targeted by this assay, and inadequate number of viral copies(<138 copies/mL). A negative result must be combined with clinical observations, patient history, and epidemiological information. The expected result is Negative.  Fact Sheet for Patients:  EntrepreneurPulse.com.au  Fact Sheet for Healthcare Providers:  IncredibleEmployment.be  This test is no t yet approved or cleared by the [place] FDA and  has been authorized for detection and/or diagnosis of SARS-CoV-2 by FDA under an Emergency Use Authorization (EUA). This EUA will remain  in effect (meaning this test can be used) for the duration of the COVID-19 declaration under Section 564(b)(1) of the Act, 21 U.S.C.section 360bbb-3(b)(1), unless the authorization is terminated  or revoked sooner.       Influenza A by PCR NEGATIVE NEGATIVE   Influenza B by PCR NEGATIVE NEGATIVE    Comment: (NOTE) The Xpert Xpress SARS-CoV-2/FLU/RSV plus assay is intended as an aid in the diagnosis of influenza from Nasopharyngeal swab specimens and should not be used as a sole basis for treatment. Nasal washings and aspirates are unacceptable for Xpert Xpress SARS-CoV-2/FLU/RSV testing.  Fact Sheet for Patients: EntrepreneurPulse.com.au  Fact Sheet for Healthcare Providers: IncredibleEmployment.be  This test is not yet approved or cleared by the [place] FDA and has been authorized for detection and/or diagnosis of  SARS-CoV-2 by FDA under an Emergency Use Authorization (EUA). This EUA will remain in effect (meaning this test can be used) for the duration of the COVID-19 declaration under Section 564(b)(1) of the Act, 21 U.S.C. section 360bbb-3(b)(1), unless the authorization is terminated or revoked.  Performed at [facility], [place] [address], [place], [place] [address]   CBG monitoring, ED     Status: Abnormal   Collection Time: 10/17/20 10:52 PM  Result Value Ref Range   Glucose-Capillary 165 (H) 70 - 99 mg/dL    Comment: Glucose reference range applies only to samples taken after fasting for at least 8 hours.  Hemoglobin A1c     Status: Abnormal   Collection Time: 10/17/20 11:02 PM  Result Value Ref Range   Hgb A1c MFr Bld 6.2 (H) 4.8 - 5.6 %    Comment: (NOTE) Pre diabetes:          5.7%-6.4%  Diabetes:              >6.4%  Glycemic control for   <7.0% adults with diabetes    Mean Plasma Glucose 131.24 mg/dL    Comment: Performed at [facility], [address] [place], [place] [address]  Troponin I (High Sensitivity)     Status: Abnormal   Collection Time: 10/17/20 11:02 PM  Result Value Ref Range   Troponin I (High Sensitivity) 26 (H) <18 ng/L    Comment: (NOTE) Elevated high sensitivity troponin I (hsTnI) values and significant  changes across serial measurements may suggest ACS but many other  chronic and acute conditions are known to elevate hsTnI results.  Refer to the "Links" section for chest pain algorithms and additional  guidance. Performed at [facility], [place] [address] [place], [place] [address]   CBG monitoring, ED     Status: None   Collection Time: 10/18/20  1:08 AM  Result Value Ref Range   Glucose-Capillary 96 70 - 99 mg/dL    Comment: Glucose reference range applies only to samples taken after fasting for at least 8 hours.  Lipid panel     Status: None   Collection Time: 10/18/20  1:18 AM  Result Value Ref Range   Cholesterol 116 0 -  200 mg/dL   Triglycerides 57 <150 mg/dL   HDL 43 >40 mg/dL   Total CHOL/HDL Ratio 2.7 RATIO   VLDL 11 0 - 40 mg/dL   LDL Cholesterol 62 0 - 99 mg/dL    Comment:        Total Cholesterol/HDL:CHD Risk Coronary Heart Disease Risk Table                     Men   Women  1/2 Average Risk   3.4   3.3  Average Risk       5.0   4.4  2 X Average Risk   9.6   7.1  3 X Average Risk  23.4   11.0        Use the calculated Patient Ratio above and the CHD Risk Table to determine the patient's CHD Risk.        ATP III CLASSIFICATION (LDL):  <100     mg/dL   Optimal  100-129  mg/dL   Near or Above                    Optimal  130-159  mg/dL   Borderline  160-189  mg/dL   High  >190     mg/dL   Very High Performed at [facility] [address] [place], [place] [address]   Troponin I (High Sensitivity)     Status: Abnormal   Collection Time: 10/18/20  1:18 AM  Result Value Ref Range   Troponin I (High Sensitivity) 28 (H) <18 ng/L    Comment: (NOTE) Elevated high sensitivity troponin I (hsTnI) values and significant  changes across serial measurements may suggest ACS but many other  chronic and acute conditions are known to elevate hsTnI results.  Refer to the "Links" section for chest pain algorithms and additional  guidance. Performed at [facility], [place] [address], [place],  [address]   CBG monitoring, ED     Status: Abnormal   Collection Time: 10/18/20  8:39 AM  Result Value Ref Range   Glucose-Capillary 130 (H) 70 - 99 mg/dL    Comment: Glucose reference range applies only to samples taken after fasting for at least 8 hours.    Medications and allergies   Allergies  Allergen Reactions  . Sulfa Antibiotics Anaphylaxis, Shortness Of Breath, Rash and Other (See Comments)    Throat and the  neck both swell     Current Meds  Medication Sig  . BAYER LOW DOSE 81 MG EC tablet Take 81 mg by mouth at bedtime. Swallow whole.  . fluticasone (FLONASE) 50 MCG/ACT  nasal spray Place 2 sprays into both nostrils daily as needed for rhinitis (or seasonal allergies).  Marland Kitchen ibuprofen (ADVIL) 200 MG tablet Take 200 mg by mouth every 6 (six) hours as needed for headache or mild pain.  Marland Kitchen loratadine (CLARITIN) 10 MG tablet Take 10 mg by mouth daily as needed for rhinitis (or seasonal allergies).  . meloxicam (MOBIC) 15 MG tablet Take 1 tablet (15 mg total) by mouth daily. (Patient taking differently: Take 15 mg by mouth daily as needed (for foot pain).)  . METFORMIN HCL PO Take 1 tablet by mouth at bedtime.  . Multiple Vitamins-Minerals (ONE-A-DAY MENS 50+ ADVANTAGE) TABS Take 1 tablet by mouth daily with breakfast.  . NON FORMULARY Take 900 mg by mouth See admin instructions. CBD 900 mg gummie- Chew 1 gummie (900 mg) by mouth at bedtime    Scheduled Meds: . aspirin  81 mg Oral Pre-Cath  . aspirin EC  81 mg Oral QHS  . enoxaparin (LOVENOX) injection  40 mg Subcutaneous QHS  . insulin aspart  0-9 Units Subcutaneous Q4H  . multivitamin with minerals  1 tablet Oral Daily  . sodium chloride flush  3 mL Intravenous Q12H   Continuous Infusions: . sodium chloride     Followed by  . sodium chloride     PRN Meds:.acetaminophen, hydrALAZINE, ondansetron (ZOFRAN) IV   No intake/output data recorded. No intake/output data recorded.    Radiology:   Imaging results have been reviewed  Cardiac Studies:   EKG:  12/22/201: Sinus rhythm with frequent PVCs at a rate of 81 bpm.  Left axis, left anterior fascicular block.  Right bundle branch block.   Assessment   Medications Discontinued During This Encounter  Medication Reason  . HYDROcodone-acetaminophen (NORCO/VICODIN) 5-325 MG tablet Not available  . ibuprofen (ADVIL) 800 MG tablet Not available  . simvastatin (ZOCOR) 10 MG tablet Dose change  . metFORMIN (GLUCOPHAGE) 500 MG tablet Entry Error  . One-A-Day Mens 50+ Advantage TABS 1 tablet   . 0.9% sodium chloride infusion   . 0.9% sodium chloride infusion    . aspirin chewable tablet 81 mg     Recommendations:   NSTEMI:  Although EKG with bifascicular block, without acute ischemic changes, no prior EKG available for comparison. Patient with atypical symptoms, however he had significant improvement of syptoms with nitroglycerin. In view of elevated troponin and ongoing intermittent angina, as well as cardiovascular risk factors, recommend cardiac catheterization with potential intervention.   Systolic Murmur:  Patient with systolic ejection murmur noted in the aortic area, as well as systolic murmur noted at the apex on exam. Patient has history of rheumatic fever at a child. Will obtain echocardiogram.   Hypertension:  Continue as needed hydralazine   Type 2 Diabetes Mellitus:  Managed per primary team.   Patient was seen in collaboration with Dr. Einar Gip. He also reviewed patient's chart and examined the patient. Dr. Einar Gip is in agreement of the plan.    Alethia Berthold, PA-C 10/18/2020, 11:29 AM Office: [phone]

## 2020-10-18 NOTE — ED Notes (Signed)
FIRST ENCOUNTER:  Patient resting in bed, no issues noted at this time.

## 2020-10-18 NOTE — Progress Notes (Addendum)
[id number] Discussed with pt the importance of brilinta with stent. Reviewed NTG use, heart healthy and low carb food choices and gave diets, walking for ex, and CRP 2. Referred to [place] CRP 2. Pt stated he has cut down to 1 to 2 cigarettes a day and plans to quit now.  Gave smoking cessation handout and encouraged to call 1800quitnow if needed. Pt voiced understanding. Graylon Good RN BSN 10/18/2020 2:08 PM

## 2020-10-18 NOTE — Interval H&P Note (Signed)
History and Physical Interval Note:  10/18/2020 12:06 PM  Anthony Adkins  has presented today for surgery, with the diagnosis of unstable angina.  The various methods of treatment have been discussed with the patient and family. After consideration of risks, benefits and other options for treatment, the patient has consented to  Procedure(s): LEFT HEART CATH AND CORONARY ANGIOGRAPHY (N/A) and possible angioplasty as a surgical intervention.  The patient's history has been reviewed, patient examined, no change in status, stable for surgery.  I have reviewed the patient's chart and labs.  Questions were answered to the patient's satisfaction.    I also called his wife and discussed in length regarding the procedure, including risks and benefits of cardiac catheterization.  This was done through telephone encounter in front of the patient using his telephone.  Cath Lab Visit (complete for each Cath Lab visit)  Clinical Evaluation Leading to the Procedure:   ACS: Yes.    Non-ACS:    Anginal Classification: CCS IV  Anti-ischemic medical therapy: Minimal Therapy (1 class of medications)  Non-Invasive Test Results: No non-invasive testing performed  Prior CABG: No previous CABG  Adrian Prows

## 2020-10-18 NOTE — Consult Note (Signed)
CARDIOLOGY CONSULT NOTE  Patient ID: Anthony Adkins MRN: [id number] DOB/AGE: [birth year] 80 y.o.  Admit date: 10/17/2020 Referring Physician  Ollen Bowl, MD Primary Physician:  [name], MD Reason for Consultation: Chest pain   Patient ID: AADHAV Adkins, male    DOB: January 31, 1941, 79 y.o.   MRN: [id number]  Chief Complaint  Patient presents with  . Chest Pain   HPI:    Anthony Adkins  is a 79 y.o. male with history of tobacco use, type 2 diabetes mellitus, rheumatic fever as a child, and hyperlipidemia. He has not previously been diagnosed with hypertension. Denies history of MI, TIA/CVA.   Patient presents to the [facility] emergency department yesterday after he was seen in urgent care for chest pain.  Patient reports at 11:30 AM he was driving and started to feel "chest tightness" in the middle of his chest 5/10 severity.  Reports chest tightness was associated with shortness of breath.  Patient's chest discomfort continued intermittently throughout the day, and therefore he presented to urgent care for evaluation around 2 PM.  At urgent care an EKG was reportedly done that was concerning and he was therefore transported to [facility] emergency department.  Patient was given sublingual nitroglycerin by EMS during transport, which reportedly significantly improved his symptoms.  Upon presentation in the emergency department patient's chest discomfort had significantly improved.  Work-up in the emergency department revealed sinus rhythm with premature ventricular contractions and bifascicular block, as well as mildly elevated, but flat troponin x4.   Patient is presently chest pain-free.  Reports he last experienced chest discomfort around 11 PM last night.  Presently denies shortness of breath, dizziness, nausea, vomiting, leg swelling.  Past Medical History:  Diagnosis Date  . Diabetes mellitus without complication (HCC)   . Heart murmur    had rheumatic fever as a child  .  Hyperlipidemia    Past Surgical History:  Procedure Laterality Date  . COLONOSCOPY  2009  . INGUINAL HERNIA REPAIR     left side/over 20 years ago  . lymph nodes     removed left side neck due to cat scratch fever   Family History  Problem Relation Age of Onset  . Hypertension Father   . Heart disease Father   . Colon cancer Neg Hx   . Esophageal cancer Neg Hx   . Rectal cancer Neg Hx   . Stomach cancer Neg Hx    Social History   Tobacco Use  . Smoking status: Light Tobacco Smoker    Types: Cigarettes  . Smokeless tobacco: Never Used  . Tobacco comment: Smokes around 5 cigarettes a day  Substance Use Topics  . Alcohol use: Yes    Alcohol/week: 7.0 - 10.0 standard drinks    Types: 7 - 10 Standard drinks or equivalent per week    Marital Sttus: Married  ROS  Review of Systems  Constitutional: Negative for diaphoresis and malaise/fatigue.  HENT: Negative.   Eyes: Negative.   Cardiovascular: Positive for chest pain. Negative for dyspnea on exertion, leg swelling, near-syncope, orthopnea, palpitations, paroxysmal nocturnal dyspnea and syncope.  Respiratory: Positive for shortness of breath. Negative for cough.   Endocrine: Negative.   Hematologic/Lymphatic: Negative for bleeding problem. Does not bruise/bleed easily.  Skin: Negative.   Musculoskeletal: Negative.   Gastrointestinal: Negative for abdominal pain, melena, nausea and vomiting.  Genitourinary: Negative.   Neurological: Negative for dizziness, focal weakness, headaches and weakness.  Psychiatric/Behavioral: Negative.   Allergic/Immunologic: Negative.  Objective   Vitals with BMI 10/18/2020 10/18/2020 10/18/2020  Height - - -  Weight - - -  BMI - - -  Systolic [id number]  Diastolic 73 64 69  Pulse 60 61 53    Blood pressure (!) 141/73, pulse 60, temperature 98.7 F (37.1 C), temperature source Oral, resp. rate 16, height 5\' 9"  (1.753 m), weight 78.5 kg, SpO2 97 %.    Physical Exam Vitals reviewed.   Constitutional:      General: He is not in acute distress.    Appearance: Normal appearance. He is normal weight. He is not ill-appearing or diaphoretic.  HENT:     Head: Normocephalic and atraumatic.     Nose: Nose normal.  Eyes:     Extraocular Movements: Extraocular movements intact.     Conjunctiva/sclera: Conjunctivae normal.  Neck:     Vascular: No carotid bruit.  Cardiovascular:     Rate and Rhythm: Normal rate and regular rhythm. Occasional extrasystoles are present.    Pulses: Intact distal pulses.          Carotid pulses are 2+ on the right side and 2+ on the left side with bruit.      Radial pulses are 2+ on the right side and 2+ on the left side.       Femoral pulses are 2+ on the right side and 2+ on the left side.      Popliteal pulses are 2+ on the right side and 2+ on the left side.       Dorsalis pedis pulses are 2+ on the right side and 2+ on the left side.       Posterior tibial pulses are 2+ on the right side and 2+ on the left side.     Heart sounds: S1 normal and S2 normal. Murmur heard.  High-pitched blowing holosystolic murmur is present with a grade of 2/6 at the apex.  Harsh midsystolic murmur of grade 2/6 is also present at the upper right sternal border.   Pulmonary:     Effort: Pulmonary effort is normal.     Breath sounds: No wheezing, rhonchi or rales.  Abdominal:     General: Bowel sounds are normal.     Palpations: Abdomen is soft.     Tenderness: There is no abdominal tenderness.  Musculoskeletal:     Cervical back: Normal range of motion and neck supple.     Right lower leg: No edema.     Left lower leg: No edema.  Skin:    General: Skin is warm and dry.     Capillary Refill: Capillary refill takes less than 2 seconds.  Neurological:     General: No focal deficit present.     Mental Status: He is alert and oriented to person, place, and time.  Psychiatric:        Mood and Affect: Mood normal.        Behavior: Behavior normal.     Laboratory examination:   Recent Labs    10/17/20 1616  NA 138  K 4.1  CL 103  CO2 25  GLUCOSE 129*  BUN 11  CREATININE 0.94  CALCIUM 8.9  GFRNONAA >60   estimated creatinine clearance is 63.7 mL/min (by C-G formula based on SCr of 0.94 mg/dL).  CMP Latest Ref Rng & Units 10/17/2020  Glucose 70 - 99 mg/dL 129(H)  BUN 8 - 23 mg/dL 11  Creatinine 0.61 - 1.24 mg/dL 0.94  Sodium 135 - 145  mmol/L 138  Potassium 3.5 - 5.1 mmol/L 4.1  Chloride 98 - 111 mmol/L 103  CO2 22 - 32 mmol/L 25  Calcium 8.9 - 10.3 mg/dL 8.9   CBC Latest Ref Rng & Units 10/17/2020  WBC 4.0 - 10.5 K/uL 11.7(H)  Hemoglobin 13.0 - 17.0 g/dL 14.2  Hematocrit 39.0 - 52.0 % 42.3  Platelets 150 - 400 K/uL 234   Lipid Panel Recent Labs    10/18/20 0118  CHOL 116  TRIG 57  LDLCALC 62  VLDL 11  HDL 43  CHOLHDL 2.7    HEMOGLOBIN A1C Lab Results  Component Value Date   HGBA1C 6.2 (H) 10/17/2020   MPG 131.24 10/17/2020   TSH No results for input(s): TSH in the last 8760 hours. BNP (last 3 results) No results for input(s): BNP in the last 8760 hours. Results for orders placed or performed during the hospital encounter of 10/17/20 (from the past 48 hour(s))  Basic metabolic panel     Status: Abnormal   Collection Time: 10/17/20  4:16 PM  Result Value Ref Range   Sodium 138 135 - 145 mmol/L   Potassium 4.1 3.5 - 5.1 mmol/L   Chloride 103 98 - 111 mmol/L   CO2 25 22 - 32 mmol/L   Glucose, Bld 129 (H) 70 - 99 mg/dL    Comment: Glucose reference range applies only to samples taken after fasting for at least 8 hours.   BUN 11 8 - 23 mg/dL   Creatinine, Ser 0.94 0.61 - 1.24 mg/dL   Calcium 8.9 8.9 - 10.3 mg/dL   GFR, Estimated >60 >60 mL/min    Comment: (NOTE) Calculated using the CKD-EPI Creatinine Equation (2021)    Anion gap 10 5 - 15    Comment: Performed at [facility] [address] [place], [place] [address]  CBC     Status: Abnormal   Collection Time: 10/17/20  4:16 PM   Result Value Ref Range   WBC 11.7 (H) 4.0 - 10.5 K/uL   RBC 4.33 4.22 - 5.81 MIL/uL   Hemoglobin 14.2 13.0 - 17.0 g/dL   HCT 42.3 39.0 - 52.0 %   MCV 97.7 80.0 - 100.0 fL   MCH 32.8 26.0 - 34.0 pg   MCHC 33.6 30.0 - 36.0 g/dL   RDW 12.1 11.5 - 15.5 %   Platelets 234 150 - 400 K/uL   nRBC 0.0 0.0 - 0.2 %    Comment: Performed at [facility], [place] [address], [place], [place] [address]  Troponin I (High Sensitivity)     Status: Abnormal   Collection Time: 10/17/20  4:16 PM  Result Value Ref Range   Troponin I (High Sensitivity) 25 (H) <18 ng/L    Comment: (NOTE) Elevated high sensitivity troponin I (hsTnI) values and significant  changes across serial measurements may suggest ACS but many other  chronic and acute conditions are known to elevate hsTnI results.  Refer to the "Links" section for chest pain algorithms and additional  guidance. Performed at [facility], [place] [address] [place], [place] [address]   Troponin I (High Sensitivity)     Status: Abnormal   Collection Time: 10/17/20  8:07 PM  Result Value Ref Range   Troponin I (High Sensitivity) 26 (H) <18 ng/L    Comment: (NOTE) Elevated high sensitivity troponin I (hsTnI) values and significant  changes across serial measurements may suggest ACS but many other  chronic and acute conditions are known  to elevate hsTnI results.  Refer to the "Links" section for chest pain algorithms and additional  guidance. Performed at [facility], [place] [address] [place], [place] [address]   Resp Panel by RT-PCR (Flu A&B, Covid) Nasopharyngeal Swab     Status: None   Collection Time: 10/17/20  9:24 PM   Specimen: Nasopharyngeal Swab; Nasopharyngeal(NP) swabs in vial transport medium  Result Value Ref Range   SARS Coronavirus 2 by RT PCR NEGATIVE NEGATIVE    Comment: (NOTE) SARS-CoV-2 target nucleic acids are NOT DETECTED.  The SARS-CoV-2 RNA is generally detectable in upper respiratory specimens during the  acute phase of infection. The lowest concentration of SARS-CoV-2 viral copies this assay can detect is 138 copies/mL. A negative result does not preclude SARS-Cov-2 infection and should not be used as the sole basis for treatment or other patient management decisions. A negative result may occur with  improper specimen collection/handling, submission of specimen other than nasopharyngeal swab, presence of viral mutation(s) within the areas targeted by this assay, and inadequate number of viral copies(<138 copies/mL). A negative result must be combined with clinical observations, patient history, and epidemiological information. The expected result is Negative.  Fact Sheet for Patients:  EntrepreneurPulse.com.au  Fact Sheet for Healthcare Providers:  IncredibleEmployment.be  This test is no t yet approved or cleared by the [place] FDA and  has been authorized for detection and/or diagnosis of SARS-CoV-2 by FDA under an Emergency Use Authorization (EUA). This EUA will remain  in effect (meaning this test can be used) for the duration of the COVID-19 declaration under Section 564(b)(1) of the Act, 21 U.S.C.section 360bbb-3(b)(1), unless the authorization is terminated  or revoked sooner.       Influenza A by PCR NEGATIVE NEGATIVE   Influenza B by PCR NEGATIVE NEGATIVE    Comment: (NOTE) The Xpert Xpress SARS-CoV-2/FLU/RSV plus assay is intended as an aid in the diagnosis of influenza from Nasopharyngeal swab specimens and should not be used as a sole basis for treatment. Nasal washings and aspirates are unacceptable for Xpert Xpress SARS-CoV-2/FLU/RSV testing.  Fact Sheet for Patients: EntrepreneurPulse.com.au  Fact Sheet for Healthcare Providers: IncredibleEmployment.be  This test is not yet approved or cleared by the [place] FDA and has been authorized for detection and/or diagnosis of  SARS-CoV-2 by FDA under an Emergency Use Authorization (EUA). This EUA will remain in effect (meaning this test can be used) for the duration of the COVID-19 declaration under Section 564(b)(1) of the Act, 21 U.S.C. section 360bbb-3(b)(1), unless the authorization is terminated or revoked.  Performed at [facility], [place] [address] [place],  [address]   CBG monitoring, ED     Status: Abnormal   Collection Time: 10/17/20 10:52 PM  Result Value Ref Range   Glucose-Capillary 165 (H) 70 - 99 mg/dL    Comment: Glucose reference range applies only to samples taken after fasting for at least 8 hours.  Hemoglobin A1c     Status: Abnormal   Collection Time: 10/17/20 11:02 PM  Result Value Ref Range   Hgb A1c MFr Bld 6.2 (H) 4.8 - 5.6 %    Comment: (NOTE) Pre diabetes:          5.7%-6.4%  Diabetes:              >6.4%  Glycemic control for   <7.0% adults with diabetes    Mean Plasma Glucose 131.24 mg/dL    Comment: Performed at [facility], [address] [place], [place] [address]  Troponin I (High Sensitivity)     Status: Abnormal   Collection Time: 10/17/20 11:02 PM  Result Value Ref Range   Troponin I (High Sensitivity) 26 (H) <18 ng/L    Comment: (NOTE) Elevated high sensitivity troponin I (hsTnI) values and significant  changes across serial measurements may suggest ACS but many other  chronic and acute conditions are known to elevate hsTnI results.  Refer to the "Links" section for chest pain algorithms and additional  guidance. Performed at [facility], [place] [address] [place], [place] [address]   CBG monitoring, ED     Status: None   Collection Time: 10/18/20  1:08 AM  Result Value Ref Range   Glucose-Capillary 96 70 - 99 mg/dL    Comment: Glucose reference range applies only to samples taken after fasting for at least 8 hours.  Lipid panel     Status: None   Collection Time: 10/18/20  1:18 AM  Result Value Ref Range   Cholesterol 116 0 -  200 mg/dL   Triglycerides 57 <150 mg/dL   HDL 43 >40 mg/dL   Total CHOL/HDL Ratio 2.7 RATIO   VLDL 11 0 - 40 mg/dL   LDL Cholesterol 62 0 - 99 mg/dL    Comment:        Total Cholesterol/HDL:CHD Risk Coronary Heart Disease Risk Table                     Men   Women  1/2 Average Risk   3.4   3.3  Average Risk       5.0   4.4  2 X Average Risk   9.6   7.1  3 X Average Risk  23.4   11.0        Use the calculated Patient Ratio above and the CHD Risk Table to determine the patient's CHD Risk.        ATP III CLASSIFICATION (LDL):  <100     mg/dL   Optimal  100-129  mg/dL   Near or Above                    Optimal  130-159  mg/dL   Borderline  160-189  mg/dL   High  >190     mg/dL   Very High Performed at [facility] [address], [place], [place] [address]   Troponin I (High Sensitivity)     Status: Abnormal   Collection Time: 10/18/20  1:18 AM  Result Value Ref Range   Troponin I (High Sensitivity) 28 (H) <18 ng/L    Comment: (NOTE) Elevated high sensitivity troponin I (hsTnI) values and significant  changes across serial measurements may suggest ACS but many other  chronic and acute conditions are known to elevate hsTnI results.  Refer to the "Links" section for chest pain algorithms and additional  guidance. Performed at [facility], [place] [address] [place], [place] [address]   CBG monitoring, ED     Status: Abnormal   Collection Time: 10/18/20  8:39 AM  Result Value Ref Range   Glucose-Capillary 130 (H) 70 - 99 mg/dL    Comment: Glucose reference range applies only to samples taken after fasting for at least 8 hours.    Medications and allergies   Allergies  Allergen Reactions  . Sulfa Antibiotics Anaphylaxis, Shortness Of Breath, Rash and Other (See Comments)    Throat and the  neck both swell     Current Meds  Medication Sig  . BAYER LOW DOSE 81 MG EC tablet Take 81 mg by mouth at bedtime. Swallow whole.  . fluticasone (FLONASE) 50 MCG/ACT  nasal spray Place 2 sprays into both nostrils daily as needed for rhinitis (or seasonal allergies).  Marland Kitchen ibuprofen (ADVIL) 200 MG tablet Take 200 mg by mouth every 6 (six) hours as needed for headache or mild pain.  Marland Kitchen loratadine (CLARITIN) 10 MG tablet Take 10 mg by mouth daily as needed for rhinitis (or seasonal allergies).  . meloxicam (MOBIC) 15 MG tablet Take 1 tablet (15 mg total) by mouth daily. (Patient taking differently: Take 15 mg by mouth daily as needed (for foot pain).)  . METFORMIN HCL PO Take 1 tablet by mouth at bedtime.  . Multiple Vitamins-Minerals (ONE-A-DAY MENS 50+ ADVANTAGE) TABS Take 1 tablet by mouth daily with breakfast.  . NON FORMULARY Take 900 mg by mouth See admin instructions. CBD 900 mg gummie- Chew 1 gummie (900 mg) by mouth at bedtime    Scheduled Meds: . aspirin  81 mg Oral Pre-Cath  . aspirin EC  81 mg Oral QHS  . enoxaparin (LOVENOX) injection  40 mg Subcutaneous QHS  . insulin aspart  0-9 Units Subcutaneous Q4H  . multivitamin with minerals  1 tablet Oral Daily  . sodium chloride flush  3 mL Intravenous Q12H   Continuous Infusions: . sodium chloride     Followed by  . sodium chloride     PRN Meds:.acetaminophen, hydrALAZINE, ondansetron (ZOFRAN) IV   No intake/output data recorded. No intake/output data recorded.    Radiology:   Imaging results have been reviewed  Cardiac Studies:   EKG:  12/22/201: Sinus rhythm with frequent PVCs at a rate of 81 bpm.  Left axis, left anterior fascicular block.  Right bundle branch block.   Assessment   Medications Discontinued During This Encounter  Medication Reason  . HYDROcodone-acetaminophen (NORCO/VICODIN) 5-325 MG tablet Not available  . ibuprofen (ADVIL) 800 MG tablet Not available  . simvastatin (ZOCOR) 10 MG tablet Dose change  . metFORMIN (GLUCOPHAGE) 500 MG tablet Entry Error  . One-A-Day Mens 50+ Advantage TABS 1 tablet   . 0.9% sodium chloride infusion   . 0.9% sodium chloride infusion    . aspirin chewable tablet 81 mg     Recommendations:   NSTEMI:  Although EKG with bifascicular block, without acute ischemic changes, no prior EKG available for comparison. Patient with atypical symptoms, however he had significant improvement of syptoms with nitroglycerin. In view of elevated troponin and ongoing intermittent angina, as well as cardiovascular risk factors, recommend cardiac catheterization with potential intervention.   Systolic Murmur:  Patient with systolic ejection murmur noted in the aortic area, as well as systolic murmur noted at the apex on exam. Patient has history of rheumatic fever at a child. Will obtain echocardiogram.   Hypertension:  Continue as needed hydralazine   Type 2 Diabetes Mellitus:  Managed per primary team.   Patient was seen in collaboration with Dr. Einar Gip. He also reviewed patient's chart and examined the patient. Dr. Einar Gip is in agreement of the plan.    Alethia Berthold, PA-C 10/18/2020, 11:29 AM Office: [phone]

## 2020-10-18 NOTE — Progress Notes (Signed)
Patient underwent successful angioplasty to the proximal LAD, LAD itself continues as a large D2.  Stent was placed into the D2, LAD ends at the mid LV level.  He can be discharged home after 6 to 8 hours of bedrest, home on metoprolol succinate 50 mg daily for hypertension and coronary artery disease, Brilinta 90 mg p.o. twice daily, aspirin 81 mg p.o. daily, resume Metformin in 2 days and atorvastatin will be started at 80 mg once a day.  Prescriptions for the above has been sent to transitional care pharmacy.  I will follow him up in 2 weeks.   Adrian Prows, MD, [facility] 10/18/2020, 1:26 PM Office: [phone] Pager: [phone]

## 2020-10-18 NOTE — Progress Notes (Signed)
PROGRESS NOTE    Anthony Adkins  LOV:[id number] DOB: 08-30-1941 DOA: 10/17/2020 PCP: Velna Hatchet, MD   Brief Narrative:   Patient is 79 year old male with past medical history of diabetes, hyperlipidemia, tobacco abuse presents to emergency department with tightness associated with shortness of breath.  Received nitro and aspirin by EMS with improvement in his symptoms.  Upon arrival to ED: Patient's vital sign stable.  Troponin  25, 26, 26, 28, A1c: 6.2, EKG concerning for bifascicular block.  Patient admitted under observation for further evaluation and management of his chest pain.  Assessment & Plan:   Chest pain: -Risk factor for CAD: Diabetes, hyperlipidemia, cigarette smoking -Troponin: 25, 26, 26, 28. -Reviewed EKG.  Chest x-ray negative for acute findings.  COVID-19 negative. -Discussed with cardiology Dr. Lucila Maine cardiac cath -We will keep him n.p.o. -Patient is currently chest pain-free.  Monitor vitals closely.  Type 2 diabetes mellitus: Well controlled.  A1c: 6.2%.  Continue sliding scale insulin and monitor blood sugar closely  Elevated blood pressure without diagnosis of hypertension: -Continue hydralazine as needed.  Monitor blood pressure closely  Leukocytosis: Mild.  WBC: 11.7.  Likely reactive.  Patient is afebrile.  Chest x-ray negative for pneumonia.  COVID-19 negative.  Repeat CBC tomorrow AM.  Tobacco abuse: Counseled about cessation  Alcohol use: Tells me that he drinks wine 3-4 times per week. -Counseled about cessation.  DVT prophylaxis: Lovenox Code Status: Full code Family Communication:  None present at bedside.  Plan of care discussed with patient in length and he verbalized understanding and agreed with it. Disposition Plan: Likely home tomorrow  Consultants:  Cardiology Procedures:   None  Antimicrobials:   None  Status is: Observation  Dispo: The patient is from: Home              Anticipated d/c is to: Home               Anticipated d/c date is: 1 day              Patient currently is not medically stable to d/c.         Subjective: Patient seen and examined in ED.  Resting comfortably on the bed.  Tells me that his chest pain and shortness of breath has resolved.  Denies nausea, vomiting, palpitation, leg swelling, orthopnea or PND.  Objective: Vitals:   10/17/20 2230 10/18/20 0030 10/18/20 0100 10/18/20 0744  BP: (!) 155/81 118/69 (!) 142/64 (!) 141/73  Pulse: 63 (!) 53 61 60  Resp: 20 15 16 16   Temp:    98.7 F (37.1 C)  TempSrc:    Oral  SpO2: 95% 94% 95% 97%  Weight:      Height:       No intake or output data in the 24 hours ending 10/18/20 1033 Filed Weights   10/17/20 2125  Weight: 78.5 kg    Examination:  General exam: Appears calm and comfortable, on room air, communicating well Respiratory system: Clear to auscultation. Respiratory effort normal. Cardiovascular system: S1 & S2 heard, RRR. No JVD, murmurs, rubs, gallops or clicks. No pedal edema. Gastrointestinal system: Abdomen is nondistended, soft and nontender. No organomegaly or masses felt. Normal bowel sounds heard. Central nervous system: Alert and oriented. No focal neurological deficits. Extremities: Symmetric 5 x 5 power. Skin: No rashes, lesions or ulcers Psychiatry: Judgement and insight appear normal. Mood & affect appropriate.    Data Reviewed: I have personally reviewed following labs and imaging studies  CBC: Recent  Labs  Lab 10/17/20 1616  WBC 11.7*  HGB 14.2  HCT 42.3  MCV 97.7  PLT 234   Basic Metabolic Panel: Recent Labs  Lab 10/17/20 1616  NA 138  K 4.1  CL 103  CO2 25  GLUCOSE 129*  BUN 11  CREATININE 0.94  CALCIUM 8.9   GFR: Estimated Creatinine Clearance: 63.7 mL/min (by C-G formula based on SCr of 0.94 mg/dL). Liver Function Tests: No results for input(s): AST, ALT, ALKPHOS, BILITOT, PROT, ALBUMIN in the last 168 hours. No results for input(s): LIPASE, AMYLASE in the  last 168 hours. No results for input(s): AMMONIA in the last 168 hours. Coagulation Profile: No results for input(s): INR, PROTIME in the last 168 hours. Cardiac Enzymes: No results for input(s): CKTOTAL, CKMB, CKMBINDEX, TROPONINI in the last 168 hours. BNP (last 3 results) No results for input(s): PROBNP in the last 8760 hours. HbA1C: Recent Labs    10/17/20 2302  HGBA1C 6.2*   CBG: Recent Labs  Lab 10/17/20 2252 10/18/20 0108 10/18/20 0839  GLUCAP 165* 96 130*   Lipid Profile: Recent Labs    10/18/20 0118  CHOL 116  HDL 43  LDLCALC 62  TRIG 57  CHOLHDL 2.7   Thyroid Function Tests: No results for input(s): TSH, T4TOTAL, FREET4, T3FREE, THYROIDAB in the last 72 hours. Anemia Panel: No results for input(s): VITAMINB12, FOLATE, FERRITIN, TIBC, IRON, RETICCTPCT in the last 72 hours. Sepsis Labs: No results for input(s): PROCALCITON, LATICACIDVEN in the last 168 hours.  Recent Results (from the past 240 hour(s))  Resp Panel by RT-PCR (Flu A&B, Covid) Nasopharyngeal Swab     Status: None   Collection Time: 10/17/20  9:24 PM   Specimen: Nasopharyngeal Swab; Nasopharyngeal(NP) swabs in vial transport medium  Result Value Ref Range Status   SARS Coronavirus 2 by RT PCR NEGATIVE NEGATIVE Final    Comment: (NOTE) SARS-CoV-2 target nucleic acids are NOT DETECTED.  The SARS-CoV-2 RNA is generally detectable in upper respiratory specimens during the acute phase of infection. The lowest concentration of SARS-CoV-2 viral copies this assay can detect is 138 copies/mL. A negative result does not preclude SARS-Cov-2 infection and should not be used as the sole basis for treatment or other patient management decisions. A negative result may occur with  improper specimen collection/handling, submission of specimen other than nasopharyngeal swab, presence of viral mutation(s) within the areas targeted by this assay, and inadequate number of viral copies(<138 copies/mL). A  negative result must be combined with clinical observations, patient history, and epidemiological information. The expected result is Negative.  Fact Sheet for Patients:  BloggerCourse.com  Fact Sheet for Healthcare Providers:  SeriousBroker.it  This test is no t yet approved or cleared by the [place] FDA and  has been authorized for detection and/or diagnosis of SARS-CoV-2 by FDA under an Emergency Use Authorization (EUA). This EUA will remain  in effect (meaning this test can be used) for the duration of the COVID-19 declaration under Section 564(b)(1) of the Act, 21 U.S.C.section 360bbb-3(b)(1), unless the authorization is terminated  or revoked sooner.       Influenza A by PCR NEGATIVE NEGATIVE Final   Influenza B by PCR NEGATIVE NEGATIVE Final    Comment: (NOTE) The Xpert Xpress SARS-CoV-2/FLU/RSV plus assay is intended as an aid in the diagnosis of influenza from Nasopharyngeal swab specimens and should not be used as a sole basis for treatment. Nasal washings and aspirates are unacceptable for Xpert Xpress SARS-CoV-2/FLU/RSV testing.  Fact Sheet for  Patients: EntrepreneurPulse.com.au  Fact Sheet for Healthcare Providers: IncredibleEmployment.be  This test is not yet approved or cleared by the [place] FDA and has been authorized for detection and/or diagnosis of SARS-CoV-2 by FDA under an Emergency Use Authorization (EUA). This EUA will remain in effect (meaning this test can be used) for the duration of the COVID-19 declaration under Section 564(b)(1) of the Act, 21 U.S.C. section 360bbb-3(b)(1), unless the authorization is terminated or revoked.  Performed at [facility], [place] [address], [place], [place] [address]       Radiology Studies: DG Chest 2 View  Result Date: 10/17/2020 CLINICAL DATA:  Chest pain EXAM: CHEST - 2 VIEW COMPARISON:  None. FINDINGS:  Cardiac shadow is at the upper limits of normal in size. Aortic calcifications are noted. Lungs are well aerated without focal infiltrate or effusion. Degenerative change thoracic spine is noted. IMPRESSION: No acute abnormality noted. Electronically Signed   By: Inez Catalina M.D.   On: 10/17/2020 16:36    Scheduled Meds: . aspirin EC  81 mg Oral QHS  . enoxaparin (LOVENOX) injection  40 mg Subcutaneous QHS  . insulin aspart  0-9 Units Subcutaneous Q4H  . multivitamin with minerals  1 tablet Oral Daily  . sodium chloride flush  3 mL Intravenous Q12H   Continuous Infusions:   LOS: 0 days   Time spent: 35 minutes  Deborah Dondero Loann Quill, MD [facility]  If 7PM-7AM, please contact night-coverage [email or web address] 10/18/2020, 10:33 AM

## 2020-10-18 NOTE — Discharge Instructions (Signed)
Drink plenty of fluid for 48 hours and keep wrist elevated at heart level for 24 hours  Radial Site Care   This sheet gives you information about how to care for yourself after your procedure. Your health care provider may also give you more specific instructions. If you have problems or questions, contact your health care provider. What can I expect after the procedure? After the procedure, it is common to have:  Bruising and tenderness at the catheter insertion area. Follow these instructions at home: Medicines  Take over-the-counter and prescription medicines only as told by your health care provider. Insertion site care 1. Follow instructions from your health care provider about how to take care of your insertion site. Make sure you: ? Wash your hands with soap and water before you change your bandage (dressing). If soap and water are not available, use hand sanitizer. ? remove your dressing as told by your health care provider. In 24 hours 2. Check your insertion site every day for signs of infection. Check for: ? Redness, swelling, or pain. ? Fluid or blood. ? Pus or a bad smell. ? Warmth. 3. Do not take baths, swim, or use a hot tub until your health care provider approves. 4. You may shower 24-48 hours after the procedure, or as directed by your health care provider. ? Remove the dressing and gently wash the site with plain soap and water. ? Pat the area dry with a clean towel. ? Do not rub the site. That could cause bleeding. 5. Do not apply powder or lotion to the site. Activity   1. For 24 hours after the procedure, or as directed by your health care provider: ? Do not flex or bend the affected arm. ? Do not push or pull heavy objects with the affected arm. ? Do not drive yourself home from the hospital or clinic. You may drive 24 hours after the procedure unless your health care provider tells you not to. ? Do not operate machinery or power tools. 2. Do not lift  anything that is heavier than 10 lb (4.5 kg), or the limit that you are told, until your health care provider says that it is safe. For 4 days 3. Ask your health care provider when it is okay to: ? Return to work or school. ? Resume usual physical activities or sports. ? Resume sexual activity. General instructions  If the catheter site starts to bleed, raise your arm and put firm pressure on the site. If the bleeding does not stop, get help right away. This is a medical emergency.  If you went home on the same day as your procedure, a responsible adult should be with you for the first 24 hours after you arrive home.  Keep all follow-up visits as told by your health care provider. This is important. Contact a health care provider if:  You have a fever.  You have redness, swelling, or yellow drainage around your insertion site. Get help right away if:  You have unusual pain at the radial site.  The catheter insertion area swells very fast.  The insertion area is bleeding, and the bleeding does not stop when you hold steady pressure on the area.  Your arm or hand becomes pale, cool, tingly, or numb. These symptoms may represent a serious problem that is an emergency. Do not wait to see if the symptoms will go away. Get medical help right away. Call your local emergency services (911 in the [place] Do not   drive yourself to the hospital. Summary  After the procedure, it is common to have bruising and tenderness at the site.  Follow instructions from your health care provider about how to take care of your radial site wound. Check the wound every day for signs of infection.  Do not lift anything that is heavier than 10 lb (4.5 kg), or the limit that you are told, until your health care provider says that it is safe. This information is not intended to replace advice given to you by your health care provider. Make sure you discuss any questions you have with your health care  provider. Document Revised: 11/18/2017 Document Reviewed: 11/18/2017 Elsevier Patient Education  2020 [employer]  

## 2020-10-18 NOTE — ED Notes (Signed)
Patient taken to cath lab with all belongings at bedside.  VSS

## 2020-10-18 NOTE — Discharge Summary (Addendum)
Physician Discharge Summary  Anthony Adkins [id number] DOB: 13-Dec-1940 DOA: 10/17/2020  PCP: Velna Hatchet, MD  Admit date: 10/17/2020 Discharge date: 10/18/2020  Admitted From: Home Disposition: Home  Recommendations for Outpatient Follow-up:  1. Follow-up with PCP in 1 week 2. Repeat CBC and BMP on follow-up visit 3. Follow-up with cardiology in 2 weeks 4. Take aspirin 81 mg daily, Brilinta 90 mg twice daily, atorvastatin 80 mg daily.  Nitro as needed for chest pain.  Resume Metformin in 2 days.  Home Health: None Equipment/Devices: None Discharge Condition: Stable CODE STATUS: Full code Diet recommendation: Cardiac healthy diet  Brief/Interim Summary: Patient is 79 year old male with past medical history of diabetes, hyperlipidemia, tobacco abuse presents to emergency department with tightness associated with shortness of breath.  Received nitro and aspirin by EMS with improvement in his symptoms.  Upon arrival to ED: Patient's vital sign stable.  Troponin  25, 26, 26, 28, A1c: 6.2, EKG concerning for bifascicular block.  Patient admitted under observation for further evaluation and management of his chest pain.  NSTEMI: -Status post left heart cath-with successful angioplasty to the proximal LAD-stent was placed. -Patient will be discharged in a stable condition on aspirin 81 mg p.o. daily, atorvastatin 80 mg daily, nitro as needed for chest pain, Brilinta 90 mg p.o. twice daily -Follow-up with cardiology in 2 weeks.  Type 2 diabetes mellitus: Well controlled.  A1c: 6.2%.  Continue sliding scale insulin and monitor blood sugar closely -Resume Metformin in 2 days as per cardiology recommendations.  Elevated blood pressure without diagnosis of hypertension: -Continued hydralazine as needed.    Leukocytosis: Mild.  WBC: 11.7.  Likely reactive.    Patient remained afebrile.  Chest x-ray negative for pneumonia.  COVID-19 negative.  Repeat CBC on follow-up with PCP in 1  week.  Tobacco abuse: Counseled about cessation  Alcohol use: Tells me that he drinks wine 3-4 times per week. -Counseled about cessation.  Discharge Diagnoses:  NSTEMI  Type 2 diabetes mellitus Elevated blood pressure without diagnosis of hypertension Leukocytosis Tobacco abuse Alcohol abuse   Discharge Instructions  Discharge Instructions    Amb Referral to Cardiac Rehabilitation   Complete by: As directed    Diagnosis: Coronary Stents   After initial evaluation and assessments completed: Virtual Based Care may be provided alone or in conjunction with Phase 2 Cardiac Rehab based on patient barriers.: Yes   Diet - low sodium heart healthy   Complete by: As directed    Discharge instructions   Complete by: As directed    Follow-up with PCP in 1 week Repeat CBC and BMP on follow-up visit Follow-up with cardiology as a scheduled.   Increase activity slowly   Complete by: As directed      Allergies as of 10/18/2020      Reactions   Sulfa Antibiotics Anaphylaxis, Shortness Of Breath, Rash, Other (See Comments)   Throat and the neck both swell      Medication List    STOP taking these medications   ibuprofen 200 MG tablet Commonly known as: ADVIL   meloxicam 15 MG tablet Commonly known as: MOBIC     TAKE these medications   atorvastatin 80 MG tablet Commonly known as: Lipitor Take 1 tablet (80 mg total) by mouth daily.   Bayer Low Dose 81 MG EC tablet Generic drug: aspirin Take 81 mg by mouth at bedtime. Swallow whole.   fluticasone 50 MCG/ACT nasal spray Commonly known as: FLONASE Place 2 sprays into both nostrils daily  as needed for rhinitis (or seasonal allergies).   loratadine 10 MG tablet Commonly known as: CLARITIN Take 10 mg by mouth daily as needed for rhinitis (or seasonal allergies).   metFORMIN 500 MG tablet Commonly known as: GLUCOPHAGE Take 1 tablet (500 mg total) by mouth at bedtime. Start taking on: October 20, 2020 What changed:    medication strength  how much to take  These instructions start on October 20, 2020. If you are unsure what to do until then, ask your doctor or other care provider.   metoprolol succinate 50 MG 24 hr tablet Commonly known as: TOPROL-XL Take 1 tablet (50 mg total) by mouth daily. Take with or immediately following a meal.   nitroGLYCERIN 0.4 MG SL tablet Commonly known as: Nitrostat Place 1 tablet (0.4 mg total) under the tongue every 5 (five) minutes as needed for chest pain.   NON FORMULARY Take 900 mg by mouth See admin instructions. CBD 900 mg gummie- Chew 1 gummie (900 mg) by mouth at bedtime   One-A-Day Mens 50+ Advantage Tabs Take 1 tablet by mouth daily with breakfast.   ticagrelor 90 MG Tabs tablet Commonly known as: Brilinta Take 1 tablet (90 mg total) by mouth 2 (two) times daily.       Follow-up Information    Call Crofton, Celeste C, PA-C.   Specialty: Cardiology Why: To be seen in 2 weeks.  Contact information: [place] [id number] [phone]              Allergies  Allergen Reactions  . Sulfa Antibiotics Anaphylaxis, Shortness Of Breath, Rash and Other (See Comments)    Throat and the neck both swell    Consultations:  Cardiology   Procedures/Studies: DG Chest 2 View  Result Date: 10/17/2020 CLINICAL DATA:  Chest pain EXAM: CHEST - 2 VIEW COMPARISON:  None. FINDINGS: Cardiac shadow is at the upper limits of normal in size. Aortic calcifications are noted. Lungs are well aerated without focal infiltrate or effusion. Degenerative change thoracic spine is noted. IMPRESSION: No acute abnormality noted. Electronically Signed   By: Inez Catalina M.D.   On: 10/17/2020 16:36   CARDIAC CATHETERIZATION  Result Date: 10/18/2020 Left Heart Catheterization 10/18/20: LV: Normal LVEF, EF around 50 to 55%.  Normal EDP. RCA: Dominant.  Mild diffuse disease. LM: Mild calcification noted.  No stenosis. CX: Large vessel.  Gives  origin to a large OM1, continues in the AV groove is a small vessel.  Mild diffuse disease noted. LAD: Gives origin to a small D1 following which there is a proximal LAD long tubular 80% stenosis which extends into D2, LAD itself ends immediately after giving origin to D2.  D2 is very large and essentially LAD equivalent.  Successful stenting with 3.5 x 26 mm resolute Onyx, stenosis reduced from 80% in the LAD and about 90% in the ostial D2 which is LAD quadrant to 0% with TIMI-3 to TIMI-3 flow. Recommendation: Patient can be discharged home today.  Strict instructions regarding smoking cessation has been discussed with the patient.  He will need Brilinta 90 mg p.o. twice daily along with aspirin 81 mg daily for at least a period of 1 year in view of unstable angina.  High intensity statin with atorvastatin 80 mg, beta-blocker metoprolol succinate 50 mg daily for hypertension and CAD will also be prescribed.  150 mL contrast utilized. Adrian Prows, MD, [place] 10/18/2020, 1:44 PM Office: [phone] Pager: [phone]  Subjective:  Patient seen and examined after the procedure.  He stated that he is doing fine.  Denies chest pain, shortness of breath, palpitation, nausea, vomiting, headache or dizziness.  Tells me that he is comfortable going home today.  Discharge Exam: Vitals:   10/18/20 1510 10/18/20 1540  BP: (!) 147/105 (!) 174/87  Pulse: 63 75  Resp: 13 19  Temp:    SpO2: 97% 97%   Vitals:   10/18/20 1408 10/18/20 1444 10/18/20 1510 10/18/20 1540  BP: 139/88 (!) 170/70 (!) 147/105 (!) 174/87  Pulse: (!) 58 (!) 57 63 75  Resp: (!) 21 17 13 19   Temp:      TempSrc:      SpO2: 97% 97% 97% 97%  Weight:      Height:        General: Pt is alert, awake, not in acute distress Cardiovascular: RRR, S1/S2 +, no rubs, no gallops Respiratory: CTA bilaterally, no wheezing, no rhonchi Abdominal: Soft, NT, ND, bowel sounds + Extremities: no edema, no cyanosis    The results of  significant diagnostics from this hospitalization (including imaging, microbiology, ancillary and laboratory) are listed below for reference.     Microbiology: Recent Results (from the past 240 hour(s))  Resp Panel by RT-PCR (Flu A&B, Covid) Nasopharyngeal Swab     Status: None   Collection Time: 10/17/20  9:24 PM   Specimen: Nasopharyngeal Swab; Nasopharyngeal(NP) swabs in vial transport medium  Result Value Ref Range Status   SARS Coronavirus 2 by RT PCR NEGATIVE NEGATIVE Final    Comment: (NOTE) SARS-CoV-2 target nucleic acids are NOT DETECTED.  The SARS-CoV-2 RNA is generally detectable in upper respiratory specimens during the acute phase of infection. The lowest concentration of SARS-CoV-2 viral copies this assay can detect is 138 copies/mL. A negative result does not preclude SARS-Cov-2 infection and should not be used as the sole basis for treatment or other patient management decisions. A negative result may occur with  improper specimen collection/handling, submission of specimen other than nasopharyngeal swab, presence of viral mutation(s) within the areas targeted by this assay, and inadequate number of viral copies(<138 copies/mL). A negative result must be combined with clinical observations, patient history, and epidemiological information. The expected result is Negative.  Fact Sheet for Patients:  EntrepreneurPulse.com.au  Fact Sheet for Healthcare Providers:  IncredibleEmployment.be  This test is no t yet approved or cleared by the [place] FDA and  has been authorized for detection and/or diagnosis of SARS-CoV-2 by FDA under an Emergency Use Authorization (EUA). This EUA will remain  in effect (meaning this test can be used) for the duration of the COVID-19 declaration under Section 564(b)(1) of the Act, 21 U.S.C.section 360bbb-3(b)(1), unless the authorization is terminated  or revoked sooner.       Influenza A by  PCR NEGATIVE NEGATIVE Final   Influenza B by PCR NEGATIVE NEGATIVE Final    Comment: (NOTE) The Xpert Xpress SARS-CoV-2/FLU/RSV plus assay is intended as an aid in the diagnosis of influenza from Nasopharyngeal swab specimens and should not be used as a sole basis for treatment. Nasal washings and aspirates are unacceptable for Xpert Xpress SARS-CoV-2/FLU/RSV testing.  Fact Sheet for Patients: EntrepreneurPulse.com.au  Fact Sheet for Healthcare Providers: IncredibleEmployment.be  This test is not yet approved or cleared by the [place] FDA and has been authorized for detection and/or diagnosis of SARS-CoV-2 by FDA under an Emergency Use Authorization (EUA). This EUA will remain in effect (meaning this test can be used)  for the duration of the COVID-19 declaration under Section 564(b)(1) of the Act, 21 U.S.C. section 360bbb-3(b)(1), unless the authorization is terminated or revoked.  Performed at [facility], [place] [address] [place], [place] [address]      Labs: BNP (last 3 results) No results for input(s): BNP in the last 8760 hours. Basic Metabolic Panel: Recent Labs  Lab 10/17/20 1616  NA 138  K 4.1  CL 103  CO2 25  GLUCOSE 129*  BUN 11  CREATININE 0.94  CALCIUM 8.9   Liver Function Tests: No results for input(s): AST, ALT, ALKPHOS, BILITOT, PROT, ALBUMIN in the last 168 hours. No results for input(s): LIPASE, AMYLASE in the last 168 hours. No results for input(s): AMMONIA in the last 168 hours. CBC: Recent Labs  Lab 10/17/20 1616  WBC 11.7*  HGB 14.2  HCT 42.3  MCV 97.7  PLT 234   Cardiac Enzymes: No results for input(s): CKTOTAL, CKMB, CKMBINDEX, TROPONINI in the last 168 hours. BNP: Invalid input(s): POCBNP CBG: Recent Labs  Lab 10/17/20 2252 10/18/20 0108 10/18/20 0839 10/18/20 1330  GLUCAP 165* 96 130* 113*   D-Dimer No results for input(s): DDIMER in the last 72 hours. Hgb A1c Recent Labs     10/17/20 2302  HGBA1C 6.2*   Lipid Profile Recent Labs    10/18/20 0118  CHOL 116  HDL 43  LDLCALC 62  TRIG 57  CHOLHDL 2.7   Thyroid function studies No results for input(s): TSH, T4TOTAL, T3FREE, THYROIDAB in the last 72 hours.  Invalid input(s): FREET3 Anemia work up No results for input(s): VITAMINB12, FOLATE, FERRITIN, TIBC, IRON, RETICCTPCT in the last 72 hours. Urinalysis No results found for: COLORURINE, APPEARANCEUR, [place], [place], GLUCOSEU, [place], [place], [place], PROTEINUR, UROBILINOGEN, NITRITE, LEUKOCYTESUR Sepsis Labs Invalid input(s): PROCALCITONIN,  WBC,  LACTICIDVEN Microbiology Recent Results (from the past 240 hour(s))  Resp Panel by RT-PCR (Flu A&B, Covid) Nasopharyngeal Swab     Status: None   Collection Time: 10/17/20  9:24 PM   Specimen: Nasopharyngeal Swab; Nasopharyngeal(NP) swabs in vial transport medium  Result Value Ref Range Status   SARS Coronavirus 2 by RT PCR NEGATIVE NEGATIVE Final    Comment: (NOTE) SARS-CoV-2 target nucleic acids are NOT DETECTED.  The SARS-CoV-2 RNA is generally detectable in upper respiratory specimens during the acute phase of infection. The lowest concentration of SARS-CoV-2 viral copies this assay can detect is 138 copies/mL. A negative result does not preclude SARS-Cov-2 infection and should not be used as the sole basis for treatment or other patient management decisions. A negative result may occur with  improper specimen collection/handling, submission of specimen other than nasopharyngeal swab, presence of viral mutation(s) within the areas targeted by this assay, and inadequate number of viral copies(<138 copies/mL). A negative result must be combined with clinical observations, patient history, and epidemiological information. The expected result is Negative.  Fact Sheet for Patients:  EntrepreneurPulse.com.au  Fact Sheet for Healthcare Providers:   IncredibleEmployment.be  This test is no t yet approved or cleared by the [place] FDA and  has been authorized for detection and/or diagnosis of SARS-CoV-2 by FDA under an Emergency Use Authorization (EUA). This EUA will remain  in effect (meaning this test can be used) for the duration of the COVID-19 declaration under Section 564(b)(1) of the Act, 21 U.S.C.section 360bbb-3(b)(1), unless the authorization is terminated  or revoked sooner.       Influenza A by PCR NEGATIVE NEGATIVE Final   Influenza B by PCR  NEGATIVE NEGATIVE Final    Comment: (NOTE) The Xpert Xpress SARS-CoV-2/FLU/RSV plus assay is intended as an aid in the diagnosis of influenza from Nasopharyngeal swab specimens and should not be used as a sole basis for treatment. Nasal washings and aspirates are unacceptable for Xpert Xpress SARS-CoV-2/FLU/RSV testing.  Fact Sheet for Patients: EntrepreneurPulse.com.au  Fact Sheet for Healthcare Providers: IncredibleEmployment.be  This test is not yet approved or cleared by the [place] FDA and has been authorized for detection and/or diagnosis of SARS-CoV-2 by FDA under an Emergency Use Authorization (EUA). This EUA will remain in effect (meaning this test can be used) for the duration of the COVID-19 declaration under Section 564(b)(1) of the Act, 21 U.S.C. section 360bbb-3(b)(1), unless the authorization is terminated or revoked.  Performed at [facility], [place] [address], [place], [place] [address]      Time coordinating discharge: Over 30 minutes  SIGNED:   Mckinley Jewel, MD  [facility] 10/18/2020, 4:20 PM Pager   If 7PM-7AM, please contact night-coverage [email or web address]

## 2020-10-22 ENCOUNTER — Telehealth: Payer: Self-pay

## 2020-10-22 ENCOUNTER — Encounter (HOSPITAL_COMMUNITY): Payer: Self-pay | Admitting: Cardiology

## 2020-10-22 NOTE — Telephone Encounter (Signed)
Tried calling patient to do his toc patient didn't answer left a vm will try again later sch/rma

## 2020-10-23 ENCOUNTER — Telehealth: Payer: Self-pay

## 2020-10-23 NOTE — Telephone Encounter (Signed)
Location of hospitalization: [facility] Reason for hospitalization:Patient was having chest pains and SOB Date of discharge: 10/18/2020 Date of first communication with patient: today Person contacting patient: Erby Pian, CMA Current symptoms: none Do you understand why you were in the Hospital: Yes Questions regarding discharge instructions: None Where were you discharged to: Home Medications reviewed: Yes Allergies reviewed: Yes Dietary changes reviewed: Yes. Discussed low fat and low salt diet.  Referals reviewed: NA Activities of Daily Living: Able to with mild limitations Any transportation issues/concerns: None Any patient concerns: None Confirmed importance & date/time of Follow up appt: Yes Confirmed with patient if condition begins to worsen call. Pt was given the office number and encouraged to call back with questions or concerns: Yes

## 2020-10-29 NOTE — Telephone Encounter (Signed)
From patient.

## 2020-11-01 ENCOUNTER — Other Ambulatory Visit: Payer: Self-pay

## 2020-11-01 ENCOUNTER — Ambulatory Visit: Payer: Medicare HMO | Admitting: Cardiology

## 2020-11-01 ENCOUNTER — Encounter: Payer: Self-pay | Admitting: Cardiology

## 2020-11-01 VITALS — BP 130/69 | HR 58 | Resp 16 | Ht 69.0 in | Wt 168.0 lb

## 2020-11-01 DIAGNOSIS — I25118 Atherosclerotic heart disease of native coronary artery with other forms of angina pectoris: Secondary | ICD-10-CM | POA: Diagnosis not present

## 2020-11-01 DIAGNOSIS — I10 Essential (primary) hypertension: Secondary | ICD-10-CM

## 2020-11-01 DIAGNOSIS — E7800 Pure hypercholesterolemia, unspecified: Secondary | ICD-10-CM | POA: Diagnosis not present

## 2020-11-01 DIAGNOSIS — R0989 Other specified symptoms and signs involving the circulatory and respiratory systems: Secondary | ICD-10-CM

## 2020-11-01 DIAGNOSIS — D485 Neoplasm of uncertain behavior of skin: Secondary | ICD-10-CM | POA: Diagnosis not present

## 2020-11-01 DIAGNOSIS — C44629 Squamous cell carcinoma of skin of left upper limb, including shoulder: Secondary | ICD-10-CM | POA: Diagnosis not present

## 2020-11-01 MED ORDER — METOPROLOL SUCCINATE ER 50 MG PO TB24: 50.0000 mg | ORAL_TABLET | Freq: Every day | ORAL | 3 refills | Status: DC

## 2020-11-01 MED ORDER — PRASUGREL HCL 10 MG PO TABS: 10.0000 mg | ORAL_TABLET | Freq: Every day | ORAL | 3 refills | Status: AC

## 2020-11-01 MED ORDER — LOSARTAN POTASSIUM 25 MG PO TABS: 25.0000 mg | ORAL_TABLET | Freq: Every evening | ORAL | 3 refills | Status: DC

## 2020-11-01 MED ORDER — ATORVASTATIN CALCIUM 40 MG PO TABS: 40.0000 mg | ORAL_TABLET | Freq: Every day | ORAL | 1 refills | Status: DC

## 2020-11-01 NOTE — Progress Notes (Signed)
Primary Physician/Referring:  [name], MD  Patient ID: Anthony Adkins, male    DOB: 1941-02-18, 80 y.o.   MRN: [id number]  Chief Complaint  Patient presents with  . Hospitalization Follow-up  . New Patient (Initial Visit)  . Coronary Artery Disease   HPI:    Anthony Adkins  is a 80 y.o. Caucasian male with hypertension, hyperlipidemia,pre-diabetes mellitus, presented with NSTEMI on 10/17/2020, underwent cardiac catheterization and stenting to the proximal LAD and discharged home after angioplasty in view of COVID-19 pandemic and patient being stable.  He now presents to the office for follow-up.  This is a TOC visit <7 days.  He is presently doing well and has not had any recurrence of angina pectoris.  Is tolerating all his medications well.  He has quit smoking completely since hospitalization.  Past Medical History:  Diagnosis Date  . Diabetes mellitus without complication (HCC)   . Heart murmur    had rheumatic fever as a child  . Hyperlipidemia    Past Surgical History:  Procedure Laterality Date  . COLONOSCOPY  2009  . CORONARY STENT INTERVENTION N/A 10/18/2020   Procedure: CORONARY STENT INTERVENTION;  Surgeon: Yates Decamp, MD;  Location: [facility];  Service: Cardiovascular;  Laterality: N/A;  . INGUINAL HERNIA REPAIR     left side/over 20 years ago  . LEFT HEART CATH AND CORONARY ANGIOGRAPHY N/A 10/18/2020   Procedure: LEFT HEART CATH AND CORONARY ANGIOGRAPHY;  Surgeon: Yates Decamp, MD;  Location: [facility];  Service: Cardiovascular;  Laterality: N/A;  . lymph nodes     removed left side neck due to cat scratch fever   Family History  Problem Relation Age of Onset  . Hypertension Father   . Heart disease Father   . Colon cancer Neg Hx   . Esophageal cancer Neg Hx   . Rectal cancer Neg Hx   . Stomach cancer Neg Hx     Social History   Tobacco Use  . Smoking status: Light Tobacco Smoker    Types: Cigarettes  . Smokeless tobacco: Never  Used  . Tobacco comment: Smokes around 5 cigarettes a day  Substance Use Topics  . Alcohol use: Yes    Alcohol/week: 7.0 - 10.0 standard drinks    Types: 7 - 10 Standard drinks or equivalent per week   Marital Status: Married  ROS  Review of Systems  Cardiovascular: Positive for dyspnea on exertion (mild and stable). Negative for chest pain and leg swelling.  Gastrointestinal: Negative for melena.   Objective  Blood pressure 130/69, pulse (!) 58, resp. rate 16, height 5\' 9"  (1.753 m), weight 168 lb (76.2 kg), SpO2 98 %.  Vitals with BMI 11/01/2020 10/18/2020 10/18/2020  Height 5\' 9"  - -  Weight 168 lbs - -  BMI 24.8 - -  Systolic 130 169 10/20/2020  Diastolic 69 79 75  Pulse 58 94 69     Physical Exam HENT:     Head: Atraumatic.  Cardiovascular:     Rate and Rhythm: Normal rate and regular rhythm.     Pulses: Intact distal pulses.          Carotid pulses are on the right side with bruit and on the left side with bruit.      Dorsalis pedis pulses are 2+ on the right side and 2+ on the left side.       Posterior tibial pulses are 2+ on the right side and 2+ on the  left side.     Heart sounds: S1 normal and S2 normal. Murmur heard.   Midsystolic murmur is present with a grade of 2/6 at the upper right sternal border. No gallop.      Comments: No edema. No JVD.  Pulmonary:     Effort: Pulmonary effort is normal.     Breath sounds: Normal breath sounds.  Abdominal:     General: Bowel sounds are normal.     Palpations: Abdomen is soft.    Laboratory examination:   Recent Labs    10/17/20 1616  NA 138  K 4.1  CL 103  CO2 25  GLUCOSE 129*  BUN 11  CREATININE 0.94  CALCIUM 8.9  GFRNONAA >60   estimated creatinine clearance is 63.7 mL/min (by C-G formula based on SCr of 0.94 mg/dL).  CMP Latest Ref Rng & Units 10/17/2020  Glucose 70 - 99 mg/dL 129(H)  BUN 8 - 23 mg/dL 11  Creatinine 0.61 - 1.24 mg/dL 0.94  Sodium 135 - 145 mmol/L 138  Potassium 3.5 - 5.1 mmol/L 4.1   Chloride 98 - 111 mmol/L 103  CO2 22 - 32 mmol/L 25  Calcium 8.9 - 10.3 mg/dL 8.9   CBC Latest Ref Rng & Units 10/17/2020  WBC 4.0 - 10.5 K/uL 11.7(H)  Hemoglobin 13.0 - 17.0 g/dL 14.2  Hematocrit 39.0 - 52.0 % 42.3  Platelets 150 - 400 K/uL 234    Lipid Panel Recent Labs    10/18/20 0118  CHOL 116  TRIG 57  LDLCALC 62  VLDL 11  HDL 43  CHOLHDL 2.7    HEMOGLOBIN A1C Lab Results  Component Value Date   HGBA1C 6.2 (H) 10/17/2020   MPG 131.24 10/17/2020   TSH No results for input(s): TSH in the last 8760 hours.  External labs:     Medications and allergies   Allergies  Allergen Reactions  . Sulfa Antibiotics Anaphylaxis, Shortness Of Breath, Rash and Other (See Comments)    Throat and the neck both swell     Outpatient Medications Prior to Visit  Medication Sig Dispense Refill  . BAYER LOW DOSE 81 MG EC tablet Take 81 mg by mouth at bedtime. Swallow whole.    . fluticasone (FLONASE) 50 MCG/ACT nasal spray Place 2 sprays into both nostrils daily as needed for rhinitis (or seasonal allergies).    . loratadine (CLARITIN) 10 MG tablet Take 10 mg by mouth daily as needed for rhinitis (or seasonal allergies).    . Multiple Vitamins-Minerals (ONE-A-DAY MENS 50+ ADVANTAGE) TABS Take 1 tablet by mouth daily with breakfast.    . nitroGLYCERIN (NITROSTAT) 0.4 MG SL tablet Place 1 tablet (0.4 mg total) under the tongue every 5 (five) minutes as needed for chest pain. 25 tablet 3  . NON FORMULARY Take 900 mg by mouth See admin instructions. CBD 900 mg gummie- Chew 1 gummie (900 mg) by mouth at bedtime    . atorvastatin (LIPITOR) 80 MG tablet Take 1 tablet (80 mg total) by mouth daily. 30 tablet 0  . metoprolol succinate (TOPROL-XL) 50 MG 24 hr tablet Take 1 tablet (50 mg total) by mouth daily. Take with or immediately following a meal. 30 tablet 2  . ticagrelor (BRILINTA) 90 MG TABS tablet Take 1 tablet (90 mg total) by mouth 2 (two) times daily. 60 tablet 2  . metFORMIN  (GLUCOPHAGE) 500 MG tablet Take 1 tablet (500 mg total) by mouth at bedtime.     No facility-administered medications prior to visit.  Radiology:   No results found.  Cardiac Studies:   Left Heart Catheterization 10/18/20:  LV: Normal LVEF, EF around 50 to 55%.  Normal EDP. RCA: Dominant.  Mild diffuse disease. LM: Mild calcification noted.  No stenosis. CX: Large vessel.  Gives origin to a large OM1, continues in the AV groove is a small vessel.  Mild diffuse disease noted. LAD: Gives origin to a small D1 following which there is a proximal LAD long tubular 80% stenosis which extends into D2, LAD itself ends immediately after giving origin to D2.  D2 is very large and essentially LAD equivalent.  Successful stenting with 3.5 x 26 mm resolute Onyx, stenosis reduced from 80% in the LAD and about 90% in the ostial D2 which is LAD equavalent to 0% with TIMI-3 to TIMI-3 flow.  Recommendation: Patient can be discharged home today.  Strict instructions regarding smoking cessation has been discussed with the patient.  He will need Brilinta 90 mg p.o. twice daily along with aspirin 81 mg daily for at least a period of 1 year in view of unstable angina.  High intensity statin with atorvastatin 80 mg, beta-blocker metoprolol succinate 50 mg daily for hypertension and CAD will also be prescribed.  150 mL contrast utilized.    EKG:    EKG 11/01/2020: Normal sinus rhythm at rate of 60 bpm, left axis deviation, left anterior fascicular block.  Right bundle branch block.  Bifascicular block.  LVH.   Compared to 10/17/2020, PVCs not present.  Assessment     ICD-10-CM   1. Coronary artery disease of native artery of native heart with stable angina pectoris (HCC)  I25.118 EKG 12-Lead    PCV ECHOCARDIOGRAM COMPLETE    prasugrel (EFFIENT) 10 MG TABS tablet    metoprolol succinate (TOPROL-XL) 50 MG 24 hr tablet    AMB referral to cardiac rehabilitation  2. Hypercholesteremia  E78.00 atorvastatin  (LIPITOR) 40 MG tablet  3. Primary hypertension  I10 losartan (COZAAR) 25 MG tablet  4. Bilateral carotid bruits  R09.89 PCV CAROTID DUPLEX (BILATERAL)     Medications Discontinued During This Encounter  Medication Reason  . metFORMIN (GLUCOPHAGE) 500 MG tablet Discontinued by provider  . ticagrelor (BRILINTA) 90 MG TABS tablet Cost of medication  . metoprolol succinate (TOPROL-XL) 50 MG 24 hr tablet Reorder  . atorvastatin (LIPITOR) 80 MG tablet     Meds ordered this encounter  Medications  . atorvastatin (LIPITOR) 40 MG tablet    Sig: Take 1 tablet (40 mg total) by mouth daily.    Dispense:  90 tablet    Refill:  1  . prasugrel (EFFIENT) 10 MG TABS tablet    Sig: Take 1 tablet (10 mg total) by mouth daily.    Dispense:  90 tablet    Refill:  3  . metoprolol succinate (TOPROL-XL) 50 MG 24 hr tablet    Sig: Take 1 tablet (50 mg total) by mouth daily. Take with or immediately following a meal.    Dispense:  90 tablet    Refill:  3  . losartan (COZAAR) 25 MG tablet    Sig: Take 1 tablet (25 mg total) by mouth every evening.    Dispense:  90 tablet    Refill:  3   Orders Placed This Encounter  Procedures  . AMB referral to cardiac rehabilitation    Referral Priority:   Routine    Referral Type:   Consultation    Number of Visits Requested:   1  .  EKG 12-Lead  . PCV ECHOCARDIOGRAM COMPLETE    Standing Status:   Future    Standing Expiration Date:   11/01/2021    Recommendations:   Anthony Adkins is a 80 y.o. Caucasian male with hypertension, hyperlipidemia, pre-diabetes mellitus, presented with NSTEMI on 10/17/2020, underwent cardiac catheterization and stenting to the proximal LAD and discharged home after angioplasty in view of COVID-19 pandemic and patient being stable.  He now presents to the office for follow-up.  This is a TOC visit <7 days.  He is presently doing well and has not had any recurrence of exertional chest pain.  He is tolerating all his medications well.   His lipids were controlled but I had started him on high-dose high intensity statin which I will reduce Lipitor from 80 mg to 40 mg daily.  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  I will add losartan 25 mg in the evening for CV protection.  Due to cost will discontinue Brilinta and switch him to Effient 10 mg daily.  He needs an echocardiogram for baseline and also to evaluate aortic sclerotic murmur and carotid duplex for bruit.  Since his recent hospitalization he has completely quit smoking and I have congratulated him.  I will refer him for cardiac rehab.  I will see him back in 6 weeks.    Adrian Prows, MD, [facility] 11/01/2020, 12:10 PM Office: [phone]

## 2020-11-02 DIAGNOSIS — E785 Hyperlipidemia, unspecified: Secondary | ICD-10-CM | POA: Diagnosis not present

## 2020-11-02 DIAGNOSIS — E1169 Type 2 diabetes mellitus with other specified complication: Secondary | ICD-10-CM | POA: Diagnosis not present

## 2020-11-02 DIAGNOSIS — Z125 Encounter for screening for malignant neoplasm of prostate: Secondary | ICD-10-CM | POA: Diagnosis not present

## 2020-11-09 DIAGNOSIS — Z0000 Encounter for general adult medical examination without abnormal findings: Secondary | ICD-10-CM | POA: Diagnosis not present

## 2020-11-09 DIAGNOSIS — Z1339 Encounter for screening examination for other mental health and behavioral disorders: Secondary | ICD-10-CM | POA: Diagnosis not present

## 2020-11-09 DIAGNOSIS — I2510 Atherosclerotic heart disease of native coronary artery without angina pectoris: Secondary | ICD-10-CM | POA: Diagnosis not present

## 2020-11-09 DIAGNOSIS — D692 Other nonthrombocytopenic purpura: Secondary | ICD-10-CM | POA: Diagnosis not present

## 2020-11-09 DIAGNOSIS — Z9861 Coronary angioplasty status: Secondary | ICD-10-CM | POA: Diagnosis not present

## 2020-11-09 DIAGNOSIS — E1169 Type 2 diabetes mellitus with other specified complication: Secondary | ICD-10-CM | POA: Diagnosis not present

## 2020-11-09 DIAGNOSIS — Z1331 Encounter for screening for depression: Secondary | ICD-10-CM | POA: Diagnosis not present

## 2020-11-09 DIAGNOSIS — R82998 Other abnormal findings in urine: Secondary | ICD-10-CM | POA: Diagnosis not present

## 2020-11-09 DIAGNOSIS — R7989 Other specified abnormal findings of blood chemistry: Secondary | ICD-10-CM | POA: Diagnosis not present

## 2020-11-15 DIAGNOSIS — Z1212 Encounter for screening for malignant neoplasm of rectum: Secondary | ICD-10-CM | POA: Diagnosis not present

## 2020-11-16 NOTE — Telephone Encounter (Signed)
Pharmacy Transitions of Care Follow-up Telephone Call  Date of discharge: 10/18/20  Discharge Diagnosis: Left heart cath  How have you been since you were released from the hospital? Patient is well, has had to switch from brilinta to effient but has had no other issues with his medications. Is aware of s/sx of bleeding to look out for.  Medication changes made at discharge: yes   Medication changes obtained and verified? yes    Medication Accessibility:  Home Pharmacy: [facility]   Patient has refills ready for him at his home pharmacy. MD sent them in at follow up.  Is the patient able to afford medications? Yes    Medication Review:  PRASUGREL (Effient) Started Prasugrel 10mg  QD on 11/01/20.  - Advised patient of medications to avoid (NSAIDs, ASA)  - Educated that Tylenol (acetaminophen) will be the preferred analgesic to prevent risk of bleeding  - Emphasized importance of monitoring for signs and symptoms of bleeding (abnormal bruising, prolonged bleeding, nose bleeds, bleeding from gums, discolored urine, black tarry stools)  - emphasized not doubling up on dosing if patient misses a dose  Follow-up Appointments:  Saw Dr. Einar Gip on 11/01/20 (Cardiology) for follow up, has another follow up scheduled in Cardiology with Dr. Einar Gip on 12/13/20, He has imaging scheduled on 11/22/20  If their condition worsens, is the pt aware to call PCP or go to the Emergency Dept.? yes  Final Patient Assessment: Patient is well, has follow ups scheduled, no issues with medications at this time.

## 2020-11-22 ENCOUNTER — Other Ambulatory Visit: Payer: Self-pay

## 2020-11-22 ENCOUNTER — Ambulatory Visit: Payer: Medicare HMO

## 2020-11-22 ENCOUNTER — Telehealth (HOSPITAL_COMMUNITY): Payer: Self-pay

## 2020-11-22 DIAGNOSIS — R0989 Other specified symptoms and signs involving the circulatory and respiratory systems: Secondary | ICD-10-CM

## 2020-11-22 DIAGNOSIS — I25118 Atherosclerotic heart disease of native coronary artery with other forms of angina pectoris: Secondary | ICD-10-CM

## 2020-11-22 DIAGNOSIS — I6523 Occlusion and stenosis of bilateral carotid arteries: Secondary | ICD-10-CM

## 2020-11-22 NOTE — Telephone Encounter (Signed)
Called patient to see if he was interested in participating in the Cardiac Rehab Program. Patient stated yes. Patient will come in for orientation on 12/27/20 @ 9AM and will attend the 9AM exercise class. Went over insurance, patient verbalized understanding.   Tourist information centre manager.

## 2020-11-22 NOTE — Telephone Encounter (Signed)
Pt insurance is active and benefits verified through [facility]. Co-pay $45.00, DED $0.00/$0.00 met, out of pocket $5,500.00/$95.00 met, co-insurance 0%. No pre-authorization required. Passport, 11/22/20 @ 11:37AM, LGX#[id number]

## 2020-11-25 ENCOUNTER — Other Ambulatory Visit: Payer: Self-pay | Admitting: Cardiology

## 2020-11-25 DIAGNOSIS — I6523 Occlusion and stenosis of bilateral carotid arteries: Secondary | ICD-10-CM

## 2020-12-03 ENCOUNTER — Telehealth (HOSPITAL_COMMUNITY): Payer: Self-pay | Admitting: Pharmacy Technician

## 2020-12-10 NOTE — Progress Notes (Signed)
Primary Physician/Referring:  Velna Hatchet, MD  Patient ID: Anthony Adkins, male    DOB: 02/23/1941, 80 y.o.   MRN: [id number]  Chief Complaint  Patient presents with  . Coronary Artery Disease  . Follow-up   HPI:    Anthony Adkins  is a 80 y.o. Caucasian male with hypertension, hyperlipidemia,pre-diabetes mellitus, presented with NSTEMI on 10/17/2020, underwent cardiac catheterization and stenting to the proximal LAD.   Patient presents for 6 week follow up of coronary artery disease. At last visit added losartan and reduced Lipitor from 80 mg to 40 mg daily. Also switched patient from Brilinta to Effient due to cost constraints.  He is tolerating guideline directed medical therapy without issue.  He has had no recurrence of angina since hospitalization and continues to refrain from use.  Patient has slowly been increasing his walking, currently walking about 5000 steps per day without issue.  He is scheduled to start cardiac rehab March 3rd. Denies chest pain, palpitations, leg swelling, syncope, near-syncope. He does continue to have mild, stable dyspnea on exertion. Virtually patient does not monitor his blood pressure on a regular basis at home.  Past Medical History:  Diagnosis Date  . Diabetes mellitus without complication ([place])   . Heart murmur    had rheumatic fever as a child  . Hyperlipidemia    Past Surgical History:  Procedure Laterality Date  . COLONOSCOPY  2009  . CORONARY STENT INTERVENTION N/A 10/18/2020   Procedure: CORONARY STENT INTERVENTION;  Surgeon: Adrian Prows, MD;  Location: [place] [facility];  Service: Cardiovascular;  Laterality: N/A;  . INGUINAL HERNIA REPAIR     left side/over 20 years ago  . LEFT HEART CATH AND CORONARY ANGIOGRAPHY N/A 10/18/2020   Procedure: LEFT HEART CATH AND CORONARY ANGIOGRAPHY;  Surgeon: Adrian Prows, MD;  Location: [place] [facility];  Service: Cardiovascular;  Laterality: N/A;  . lymph nodes     removed left side neck due to  cat scratch fever   Family History  Problem Relation Age of Onset  . Hypertension Father   . Heart disease Father   . Colon cancer Neg Hx   . Esophageal cancer Neg Hx   . Rectal cancer Neg Hx   . Stomach cancer Neg Hx     Social History   Tobacco Use  . Smoking status: Light Tobacco Smoker    Types: Cigarettes  . Smokeless tobacco: Never Used  . Tobacco comment: Smokes around 5 cigarettes a day  Substance Use Topics  . Alcohol use: Yes    Alcohol/week: 7.0 - 10.0 standard drinks    Types: 7 - 10 Standard drinks or equivalent per week   Marital Status: Married  ROS  Review of Systems  Constitutional: Negative for malaise/fatigue and weight gain.  Cardiovascular: Positive for dyspnea on exertion (mild and stable). Negative for chest pain, claudication, leg swelling, near-syncope, orthopnea, palpitations, paroxysmal nocturnal dyspnea and syncope.  Respiratory: Negative for shortness of breath.   Hematologic/Lymphatic: Does not bruise/bleed easily.  Gastrointestinal: Negative for melena.  Neurological: Negative for dizziness and weakness.   Objective  Blood pressure 136/70, pulse (!) 53, temperature 98.3 F (36.8 C), height 5\' 9"  (1.753 m), weight 178 lb (80.7 kg), SpO2 99 %.  Vitals with BMI 12/11/2020 11/01/2020 10/18/2020  Height 5\' 9"  5\' 9"  -  Weight 178 lbs 168 lbs -  BMI 88.41 66.0 -  Systolic 630 160 109  Diastolic 70 69 79  Pulse 53 58 94  Physical Exam HENT:     Head: Atraumatic.  Cardiovascular:     Rate and Rhythm: Normal rate and regular rhythm.     Pulses: Intact distal pulses.          Carotid pulses are on the right side with bruit and on the left side with bruit.      Dorsalis pedis pulses are 2+ on the right side and 2+ on the left side.       Posterior tibial pulses are 2+ on the right side and 2+ on the left side.     Heart sounds: S1 normal and S2 normal. Murmur heard.   Midsystolic murmur is present with a grade of 2/6 at the upper right sternal  border. No gallop.      Comments: No edema. No JVD.  Pulmonary:     Effort: Pulmonary effort is normal.     Breath sounds: Normal breath sounds.  Skin:    General: Skin is warm and dry.  Neurological:     General: No focal deficit present.     Mental Status: He is oriented to person, place, and time.  Psychiatric:        Mood and Affect: Mood normal.        Behavior: Behavior normal.    Laboratory examination:   Recent Labs    10/17/20 1616  NA 138  K 4.1  CL 103  CO2 25  GLUCOSE 129*  BUN 11  CREATININE 0.94  CALCIUM 8.9  GFRNONAA >60   CrCl cannot be calculated (Patient's most recent lab result is older than the maximum 21 days allowed.).  CMP Latest Ref Rng & Units 10/17/2020  Glucose 70 - 99 mg/dL 129(H)  BUN 8 - 23 mg/dL 11  Creatinine 0.61 - 1.24 mg/dL 0.94  Sodium 135 - 145 mmol/L 138  Potassium 3.5 - 5.1 mmol/L 4.1  Chloride 98 - 111 mmol/L 103  CO2 22 - 32 mmol/L 25  Calcium 8.9 - 10.3 mg/dL 8.9   CBC Latest Ref Rng & Units 10/17/2020  WBC 4.0 - 10.5 K/uL 11.7(H)  Hemoglobin 13.0 - 17.0 g/dL 14.2  Hematocrit 39.0 - 52.0 % 42.3  Platelets 150 - 400 K/uL 234    Lipid Panel Recent Labs    10/18/20 0118  CHOL 116  TRIG 57  LDLCALC 62  VLDL 11  HDL 43  CHOLHDL 2.7    HEMOGLOBIN A1C Lab Results  Component Value Date   HGBA1C 6.2 (H) 10/17/2020   MPG 131.24 10/17/2020   TSH No results for input(s): TSH in the last 8760 hours.  External labs:   11/02/2020: Glucose 137, creatinine 0.8, GFR 93.3, sodium 130, potassium 4.6, BUN 14, CMP otherwise normal Hemoglobin 15.8, hematocrit 46.5, MCV 95.9 daily to 12, CBC otherwise normal Cholesterol 100 from triglycerides 62, HDL 35, LDL 53, non-HDL 65 Lipoprotein B 60.0 TSH 0.91 A1c 6.2%   Medications and allergies   Allergies  Allergen Reactions  . Sulfa Antibiotics Anaphylaxis, Shortness Of Breath, Rash and Other (See Comments)    Throat and the neck both swell     Outpatient Medications  Prior to Visit  Medication Sig Dispense Refill  . atorvastatin (LIPITOR) 40 MG tablet Take 1 tablet (40 mg total) by mouth daily. 90 tablet 1  . BAYER LOW DOSE 81 MG EC tablet Take 81 mg by mouth at bedtime. Swallow whole.    . fluticasone (FLONASE) 50 MCG/ACT nasal spray Place 2 sprays into both nostrils daily as  needed for rhinitis (or seasonal allergies).    . loratadine (CLARITIN) 10 MG tablet Take 10 mg by mouth daily as needed for rhinitis (or seasonal allergies).    . metoprolol succinate (TOPROL-XL) 50 MG 24 hr tablet Take 1 tablet (50 mg total) by mouth daily. Take with or immediately following a meal. 90 tablet 3  . Multiple Vitamins-Minerals (ONE-A-DAY MENS 50+ ADVANTAGE) TABS Take 1 tablet by mouth daily with breakfast.    . nitroGLYCERIN (NITROSTAT) 0.4 MG SL tablet Place 1 tablet (0.4 mg total) under the tongue every 5 (five) minutes as needed for chest pain. 25 tablet 3  . NON FORMULARY Take 900 mg by mouth See admin instructions. CBD 900 mg gummie- Chew 1 gummie (900 mg) by mouth at bedtime    . prasugrel (EFFIENT) 10 MG TABS tablet Take 1 tablet (10 mg total) by mouth daily. 90 tablet 3  . losartan (COZAAR) 25 MG tablet Take 1 tablet (25 mg total) by mouth every evening. 90 tablet 3   No facility-administered medications prior to visit.    Radiology:   No results found.  Cardiac Studies:   Carotid artery duplex 2020/12/20:  Stenosis in the right internal carotid artery (16-49%). Stenosis in the right external carotid artery (<50%).  Doppler velocity suggests stenosis in the left internal carotid artery (50-69%).  Stenosis in the left external carotid artery (<50%).  Antegrade right vertebral artery flow. Antegrade left vertebral artery flow.  Follow up in six months is appropriate if clinically indicated.  PCV ECHOCARDIOGRAM COMPLETE 17/40/8144 Normal LV systolic function with visual EF 55-60%. Left ventricle cavity is normal in size. Mild left ventricular hypertrophy.  Normal global wall motion. Indeterminate diastolic filling pattern, normal LAP. Left atrial cavity is severely dilated. Aortic valve sclerosis without stenosis. Mild (Grade I) aortic regurgitation. Mild (Grade I) mitral regurgitation. Mild to moderate tricuspid regurgitation. No evidence of pulmonary hypertension. Mild pulmonic regurgitation. IVC is dilated with respiratory variation. No prior study for comparison.   Left Heart Catheterization 10/18/20:  LV: Normal LVEF, EF around 50 to 55%.  Normal EDP. RCA: Dominant.  Mild diffuse disease. LM: Mild calcification noted.  No stenosis. CX: Large vessel.  Gives origin to a large OM1, continues in the AV groove is a small vessel.  Mild diffuse disease noted. LAD: Gives origin to a small D1 following which there is a proximal LAD long tubular 80% stenosis which extends into D2, LAD itself ends immediately after giving origin to D2.  D2 is very large and essentially LAD equivalent.  Successful stenting with 3.5 x 26 mm resolute Onyx, stenosis reduced from 80% in the LAD and about 90% in the ostial D2 which is LAD equavalent to 0% with TIMI-3 to TIMI-3 flow.  Recommendation: Patient can be discharged home today.  Strict instructions regarding smoking cessation has been discussed with the patient.  He will need Brilinta 90 mg p.o. twice daily along with aspirin 81 mg daily for at least a period of 1 year in view of unstable angina.  High intensity statin with atorvastatin 80 mg, beta-blocker metoprolol succinate 50 mg daily for hypertension and CAD will also be prescribed.  150 mL contrast utilized.    EKG:   EKG 11/01/2020: Normal sinus rhythm at rate of 60 bpm, left axis deviation, left anterior fascicular block.  Right bundle branch block.  Bifascicular block.  LVH.   Compared to 10/17/2020, PVCs not present.  Assessment     ICD-10-CM   1. Coronary artery disease  of native artery of native heart with stable angina pectoris ([place])  I25.118    2. Asymptomatic bilateral carotid artery stenosis  I65.23   3. Primary hypertension  I10 losartan (COZAAR) 50 MG tablet    Basic metabolic panel    BASIC METABOLIC PANEL WITH GFR  4. Hypercholesteremia  E78.00      Medications Discontinued During This Encounter  Medication Reason  . losartan (COZAAR) 25 MG tablet     Meds ordered this encounter  Medications  . losartan (COZAAR) 50 MG tablet    Sig: Take 1 tablet (50 mg total) by mouth every evening.    Dispense:  90 tablet    Refill:  3   Recommendations:   RYVER POBLETE is a 80 y.o. Caucasian male with hypertension, hyperlipidemia, pre-diabetes mellitus, presented with NSTEMI on 10/17/2020, underwent cardiac catheterization and stenting to the proximal LAD.   Patient presents for 6 week follow up of coronary artery disease. He is presently doing well, without recurrence of angina and no clinical signs of heart failure. I personally reviewed external labs, his lipids are under excellent control and renal function is stable. I reviewed and discussed results of echocardiogram and carotid artery duplex with patient, details above. Will repeat carotid artery duplex in 6 months. 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   In regard to hypertension, blood pressure remains uncontrolled. Will increase losartan from 25 mg to 50 mg daily and repeat BMP in 7-10 days. Advised patient to monitor his blood pressure daily at home.   Follow up in 8 weeks, sooner if needed, for hypertension and CAD.    Alethia Berthold, PA-C 12/11/2020, 10:40 AM Office: [phone]

## 2020-12-11 ENCOUNTER — Ambulatory Visit: Payer: Medicare HMO | Admitting: Student

## 2020-12-11 ENCOUNTER — Encounter: Payer: Self-pay | Admitting: Student

## 2020-12-11 ENCOUNTER — Other Ambulatory Visit: Payer: Self-pay

## 2020-12-11 VITALS — BP 136/70 | HR 53 | Temp 98.3°F | Ht 69.0 in | Wt 178.0 lb

## 2020-12-11 DIAGNOSIS — I25118 Atherosclerotic heart disease of native coronary artery with other forms of angina pectoris: Secondary | ICD-10-CM

## 2020-12-11 DIAGNOSIS — I10 Essential (primary) hypertension: Secondary | ICD-10-CM | POA: Diagnosis not present

## 2020-12-11 DIAGNOSIS — E7800 Pure hypercholesterolemia, unspecified: Secondary | ICD-10-CM | POA: Diagnosis not present

## 2020-12-11 DIAGNOSIS — I6523 Occlusion and stenosis of bilateral carotid arteries: Secondary | ICD-10-CM | POA: Diagnosis not present

## 2020-12-11 MED ORDER — LOSARTAN POTASSIUM 50 MG PO TABS
50.0000 mg | ORAL_TABLET | Freq: Every evening | ORAL | 3 refills | Status: DC
Start: 2020-12-11 — End: 2021-12-10

## 2020-12-11 NOTE — Patient Instructions (Signed)
Go to [facility] in 7-10 days for blood work. If unable to find [facility] in [facility], go to [facility] for blood work.

## 2020-12-13 ENCOUNTER — Ambulatory Visit: Payer: Medicare HMO | Admitting: Cardiology

## 2020-12-24 DIAGNOSIS — I10 Essential (primary) hypertension: Secondary | ICD-10-CM | POA: Diagnosis not present

## 2020-12-25 ENCOUNTER — Telehealth (HOSPITAL_COMMUNITY): Payer: Self-pay

## 2020-12-25 DIAGNOSIS — E119 Type 2 diabetes mellitus without complications: Secondary | ICD-10-CM | POA: Diagnosis not present

## 2020-12-25 LAB — BASIC METABOLIC PANEL
BUN/Creatinine Ratio: 13 (ref 10–24)
BUN: 12 mg/dL (ref 8–27)
CO2: 25 mmol/L (ref 20–29)
Calcium: 9.1 mg/dL (ref 8.6–10.2)
Chloride: 100 mmol/L (ref 96–106)
Creatinine, Ser: 0.92 mg/dL (ref 0.76–1.27)
Glucose: 136 mg/dL — ABNORMAL HIGH (ref 65–99)
Potassium: 4.9 mmol/L (ref 3.5–5.2)
Sodium: 139 mmol/L (ref 134–144)
eGFR: 85 mL/min/{1.73_m2} (ref >59)

## 2020-12-25 NOTE — Progress Notes (Signed)
Kidney function and electrolytes stable, continue losartan 50 mg daily.

## 2020-12-25 NOTE — Progress Notes (Signed)
Attempted to call pt, no answer. Left vm requesting call back.

## 2020-12-26 ENCOUNTER — Encounter (HOSPITAL_COMMUNITY): Payer: Self-pay | Admitting: *Deleted

## 2020-12-26 NOTE — Progress Notes (Signed)
Pt called back, spoke to pt regarding lab results. Pt voiced understanding.

## 2020-12-26 NOTE — Telephone Encounter (Signed)
Cardiac Rehab - Pharmacy Resident Documentation   Patient unable to be reached after three call attempts. Please complete allergy verification and medication review during patient's cardiac rehab appointment.     

## 2020-12-26 NOTE — Progress Notes (Signed)
Called pt to give reminder of Cardiac Rehab orientation appointment and  completed health history. We discussed mask, proper shoes, directions to the department, Covid questions and our contact number if he has any questions. He voices understanding and is planning to attend 12/27/20 at 0900.

## 2020-12-26 NOTE — Progress Notes (Signed)
Second attempt to call pt, no answer. Left vm requesting call back.

## 2020-12-27 ENCOUNTER — Encounter (HOSPITAL_COMMUNITY)
Admission: RE | Admit: 2020-12-27 | Discharge: 2020-12-27 | Disposition: A | Discharge status: Home / Self Care | Payer: Medicare HMO | Source: Ambulatory Visit | Attending: Cardiology | Admitting: Cardiology

## 2020-12-27 ENCOUNTER — Telehealth: Payer: Self-pay

## 2020-12-27 ENCOUNTER — Other Ambulatory Visit: Payer: Self-pay

## 2020-12-27 ENCOUNTER — Encounter (HOSPITAL_COMMUNITY): Payer: Self-pay

## 2020-12-27 VITALS — BP 124/60 | Ht 68.0 in | Wt 174.8 lb

## 2020-12-27 DIAGNOSIS — I25118 Atherosclerotic heart disease of native coronary artery with other forms of angina pectoris: Secondary | ICD-10-CM | POA: Insufficient documentation

## 2020-12-27 DIAGNOSIS — Z955 Presence of coronary angioplasty implant and graft: Secondary | ICD-10-CM | POA: Insufficient documentation

## 2020-12-27 DIAGNOSIS — I214 Non-ST elevation (NSTEMI) myocardial infarction: Secondary | ICD-10-CM

## 2020-12-27 HISTORY — DX: Atherosclerotic heart disease of native coronary artery without angina pectoris: I25.10

## 2020-12-27 HISTORY — DX: Essential (primary) hypertension: I10

## 2020-12-27 NOTE — Progress Notes (Signed)
Cardiac Individual Treatment Plan  Patient Details  Name: Anthony Adkins MRN: [id number] Date of Birth: Dec 06, 1940 Referring Provider:   Flowsheet Row CARDIAC REHAB PHASE II ORIENTATION from 12/27/2020 in [facility]  Referring Provider Dr. 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  atorvastatin (LIPITOR) 40 MG tablet, Take 1 tablet (40 mg total) by mouth daily., Disp: 90 tablet, Rfl: 1 .  BAYER LOW DOSE 81 MG EC tablet, Take 81 mg by mouth at bedtime. Swallow whole., Disp: , Rfl:  .  fluticasone (FLONASE) 50 MCG/ACT nasal spray, Place 2 sprays into both nostrils daily as needed for rhinitis (or seasonal allergies)., Disp: , Rfl:  .  loratadine (CLARITIN) 10 MG tablet, Take 10 mg by mouth daily as needed for rhinitis (or seasonal allergies)., Disp: , Rfl:  .  losartan (COZAAR) 50 MG tablet, Take 1 tablet (50 mg total) by mouth every evening., Disp: 90 tablet, Rfl: 3 .  metoprolol succinate (TOPROL-XL) 50 MG 24 hr tablet, Take 1 tablet (50 mg total) by mouth daily. Take with or immediately following a meal., Disp: 90 tablet, Rfl: 3 .  Multiple Vitamins-Minerals (ONE-A-DAY MENS 50+ ADVANTAGE) TABS, Take 1 tablet by mouth daily with breakfast., Disp: , Rfl:  .  nitroGLYCERIN (NITROSTAT) 0.4 MG SL tablet, Place 1 tablet (0.4 mg total) under the tongue every 5 (five) minutes as needed for chest pain., Disp: 25 tablet, Rfl: 3 .  NON FORMULARY, Take 900 mg by mouth See admin instructions. CBD 900 mg gummie- Chew 1 gummie (900 mg) by mouth at bedtime, Disp: , Rfl:  .  prasugrel (EFFIENT) 10 MG TABS tablet, Take 1 tablet (10 mg total) by mouth daily., Disp: 90 tablet, Rfl: 3  Past Medical  History: Past Medical History:  Diagnosis Date  . Diabetes mellitus without complication ([place])   . Heart murmur    had rheumatic fever as a child  . Hyperlipidemia     Tobacco Use: Social History   Tobacco Use  Smoking Status Light Tobacco Smoker  . Types: Cigarettes  Smokeless Tobacco Never Used  Tobacco Comment   Smokes around 5 cigarettes a day    Labs: Recent Review Flowsheet Data    Labs for ITP Cardiac and Pulmonary Rehab Latest Ref Rng & Units 10/17/2020 10/18/2020   Cholestrol 0 - 200 mg/dL - 116   LDLCALC 0 - 99 mg/dL - 62   HDL >40 mg/dL - 43   Trlycerides <150 mg/dL - 57   Hemoglobin A1c 4.8 - 5.6 % 6.2(H) -      Capillary Blood Glucose: Lab Results  Component Value Date   GLUCAP 113 (H) 10/18/2020   GLUCAP 130 (H) 10/18/2020   GLUCAP 96 10/18/2020   GLUCAP 165 (H) 10/17/2020     Exercise Target Goals: Exercise Program Goal: Individual exercise prescription set using results from initial 6 min walk test and THRR while considering  patient's activity barriers and safety.   Exercise Prescription Goal: Starting with aerobic activity 30 plus minutes a day, 3 days per week for initial exercise prescription. Provide home exercise prescription and guidelines that participant acknowledges understanding prior to discharge.  Activity Barriers & Risk Stratification:  Activity  Barriers & Cardiac Risk Stratification - 12/27/20 1250      Activity Barriers & Cardiac Risk Stratification   Activity Barriers Balance Concerns    Cardiac Risk Stratification Moderate           6 Minute Walk:  6 Minute Walk    Row Name 12/27/20 0936         6 Minute Walk   Phase Initial     Distance 1570 feet     Walk Time 6 minutes     # of Rest Breaks 0     MPH 2.97     METS 2.82     RPE 11     Perceived Dyspnea  1     VO2 Peak 9.88     Symptoms Yes (comment)     Comments SOB, RPD = 1     Resting HR 53 bpm     Resting BP 124/60     Resting Oxygen Saturation  99 %      Exercise Oxygen Saturation  during 6 min walk 98 %     Max Ex. HR 72 bpm     Max Ex. BP 158/66     2 Minute Post BP 142/60            Oxygen Initial Assessment:   Oxygen Re-Evaluation:   Oxygen Discharge (Final Oxygen Re-Evaluation):   Initial Exercise Prescription:  Initial Exercise Prescription - 12/27/20 1200      Date of Initial Exercise RX and Referring Provider   Date 12/27/20    Referring Provider Dr. Adrian Prows    Expected Discharge Date 02/22/21      NuStep   Level 3    SPM 75    Minutes 15    METs 2.2      Arm Ergometer   Level 2    RPM 60    Minutes 15    METs 2      Prescription Details   Frequency (times per week) 3    Duration Progress to 30 minutes of continuous aerobic without signs/symptoms of physical distress      Intensity   THRR 40-80% of Max Heartrate 56-113    Ratings of Perceived Exertion 11-13    Perceived Dyspnea 0-4      Progression   Progression Continue progressive overload as per policy without signs/symptoms or physical distress.      Resistance Training   Training Prescription Yes    Weight 4 lbs    Reps 10-15           Perform Capillary Blood Glucose checks as needed.  Exercise Prescription Changes:   Exercise Comments:   Exercise Goals and Review:  Exercise Goals    Row Name 12/27/20 1251             Exercise Goals   Increase Physical Activity Yes       Intervention Provide advice, education, support and counseling about physical activity/exercise needs.;Develop an individualized exercise prescription for aerobic and resistive training based on initial evaluation findings, risk stratification, comorbidities and participant's personal goals.       Expected Outcomes Short Term: Attend rehab on a regular basis to increase amount of physical activity.;Long Term: Add in home exercise to make exercise part of routine and to increase amount of physical activity.;Long Term: Exercising regularly at least 3-5  days a week.       Increase Strength and Stamina Yes       Intervention Provide advice,  education, support and counseling about physical activity/exercise needs.;Develop an individualized exercise prescription for aerobic and resistive training based on initial evaluation findings, risk stratification, comorbidities and participant's personal goals.       Expected Outcomes Short Term: Increase workloads from initial exercise prescription for resistance, speed, and METs.;Long Term: Improve cardiorespiratory fitness, muscular endurance and strength as measured by increased METs and functional capacity (6MWT);Short Term: Perform resistance training exercises routinely during rehab and add in resistance training at home       Able to understand and use rate of perceived exertion (RPE) scale Yes       Intervention Provide education and explanation on how to use RPE scale       Expected Outcomes Short Term: Able to use RPE daily in rehab to express subjective intensity level;Long Term:  Able to use RPE to guide intensity level when exercising independently       Knowledge and understanding of Target Heart Rate Range (THRR) Yes       Intervention Provide education and explanation of THRR including how the numbers were predicted and where they are located for reference       Expected Outcomes Short Term: Able to state/look up THRR;Short Term: Able to use daily as guideline for intensity in rehab;Long Term: Able to use THRR to govern intensity when exercising independently       Understanding of Exercise Prescription Yes       Intervention Provide education, explanation, and written materials on patient's individual exercise prescription       Expected Outcomes Short Term: Able to explain program exercise prescription;Long Term: Able to explain home exercise prescription to exercise independently              Exercise Goals Re-Evaluation :    Discharge Exercise Prescription (Final Exercise Prescription  Changes):   Nutrition:  Target Goals: Understanding of nutrition guidelines, daily intake of sodium 1500mg , cholesterol 200mg , calories 30% from fat and 7% or less from saturated fats, daily to have 5 or more servings of fruits and vegetables.  Biometrics:  Pre Biometrics - 12/27/20 0900      Pre Biometrics   Waist Circumference 39.25 inches    Hip Circumference 39 inches    Waist to Hip Ratio 1.01 %    Triceps Skinfold 14 mm    % Body Fat 26.9 %    Grip Strength 32 kg    Flexibility 0 in   Could not reach   Single Leg Stand 8.5 seconds            Nutrition Therapy Plan and Nutrition Goals:   Nutrition Assessments:  MEDIFICTS Score Key:  ?70 Need to make dietary changes   40-70 Heart Healthy Diet  ? 40 Therapeutic Level Cholesterol Diet   Picture Your Plate Scores:  <79 Unhealthy dietary pattern with much room for improvement.  41-50 Dietary pattern unlikely to meet recommendations for good health and room for improvement.  51-60 More healthful dietary pattern, with some room for improvement.   >60 Healthy dietary pattern, although there may be some specific behaviors that could be improved.    Nutrition Goals Re-Evaluation:   Nutrition Goals Discharge (Final Nutrition Goals Re-Evaluation):   Psychosocial: Target Goals: Acknowledge presence or absence of significant depression and/or stress, maximize coping skills, provide positive support system. Participant is able to verbalize types and ability to use techniques and skills needed for reducing stress and depression.  Initial Review & Psychosocial Screening:  Initial Psych Review &  Screening - 12/27/20 1313      Initial Review   Current issues with None Identified      Family Dynamics   Good Support System? Yes   [name] has his wife for support     Barriers   Psychosocial barriers to participate in program There are no identifiable barriers or psychosocial needs.      Screening Interventions    Interventions Encouraged to exercise           Quality of Life Scores:  Quality of Life - 12/27/20 1242      Quality of Life   Select Quality of Life      Quality of Life Scores   Health/Function Pre 27.47 %    Socioeconomic Pre 27.69 %    Psych/Spiritual Pre 29.14 %    Family Pre 26.1 %    GLOBAL Pre 27.66 %          Scores of 19 and below usually indicate a poorer quality of life in these areas.  A difference of  2-3 points is a clinically meaningful difference.  A difference of 2-3 points in the total score of the Quality of Life Index has been associated with significant improvement in overall quality of life, self-image, physical symptoms, and general health in studies assessing change in quality of life.  PHQ-9: Recent Review Flowsheet Data    Depression screen [facility] 2 2/9 12/27/2020   Decreased Interest 0   Down, Depressed, Hopeless 0   PHQ - 2 Score 0     Interpretation of Total Score  Total Score Depression Severity:  1-4 = Minimal depression, 5-9 = Mild depression, 10-14 = Moderate depression, 15-19 = Moderately severe depression, 20-27 = Severe depression   Psychosocial Evaluation and Intervention:   Psychosocial Re-Evaluation:   Psychosocial Discharge (Final Psychosocial Re-Evaluation):   Vocational Rehabilitation: Provide vocational rehab assistance to qualifying candidates.   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 Participant will state understanding/return demonstration of topics presented.  Learning Barriers/Preferences:  Learning Barriers/Preferences - 12/27/20 1243      Learning  Barriers/Preferences   Learning Barriers Sight;Hearing   Wears glasses and hearing aids   Learning Preferences Video;Pictoral;Computer/Internet;Individual Instruction;Skilled Demonstration           Education Topics: Hypertension, Hypertension Reduction -Define heart disease and high blood pressure. Discus how high blood pressure affects the body and ways to reduce high blood pressure.   Exercise and Your Heart -Discuss why it is important to exercise, the FITT principles of exercise, normal and abnormal responses to exercise, and how to exercise safely.   Angina -Discuss definition of angina, causes of angina, treatment of angina, and how to decrease risk of having angina.   Cardiac Medications -Review what the following cardiac medications are used for, how they affect the body, and side effects that may occur when taking the medications.  Medications include Aspirin, Beta blockers, calcium channel blockers, ACE Inhibitors, angiotensin receptor blockers, diuretics, digoxin, and antihyperlipidemics.   Congestive Heart Failure -Discuss the definition of CHF, how to live with CHF, the signs and symptoms of CHF, and how keep track  of weight and sodium intake.   Heart Disease and Intimacy -Discus the effect sexual activity has on the heart, how changes occur during intimacy as we age, and safety during sexual activity.   Smoking Cessation / COPD -Discuss different methods to quit smoking, the health benefits of quitting smoking, and the definition of COPD.   Nutrition I: Fats -Discuss the types of cholesterol, what cholesterol does to the heart, and how cholesterol levels can be controlled.   Nutrition II: Labels -Discuss the different components of food labels and how to read food label   Heart Parts/Heart Disease and PAD -Discuss the anatomy of the heart, the pathway of blood circulation through the heart, and these are affected by heart disease.   Stress I: Signs and  Symptoms -Discuss the causes of stress, how stress may lead to anxiety and depression, and ways to limit stress.   Stress II: Relaxation -Discuss different types of relaxation techniques to limit stress.   Warning Signs of Stroke / TIA -Discuss definition of a stroke, what the signs and symptoms are of a stroke, and how to identify when someone is having stroke.   Knowledge Questionnaire Score:  Knowledge Questionnaire Score - 12/27/20 1242      Knowledge Questionnaire Score   Pre Score 20/24           Core Components/Risk Factors/Patient Goals at Admission:  Personal Goals and Risk Factors at Admission - 12/27/20 1246      Core Components/Risk Factors/Patient Goals on Admission    Weight Management Weight Maintenance;Yes    Intervention Weight Management: Develop a combined nutrition and exercise program designed to reach desired caloric intake, while maintaining appropriate intake of nutrient and fiber, sodium and fats, and appropriate energy expenditure required for the weight goal.;Weight Management: Provide education and appropriate resources to help participant work on and attain dietary goals.    Expected Outcomes Weight Maintenance: Understanding of the daily nutrition guidelines, which includes 25-35% calories from fat, 7% or less cal from saturated fats, less than 200mg  cholesterol, less than 1.5gm of sodium, & 5 or more servings of fruits and vegetables daily;Short Term: Continue to assess and modify interventions until short term weight is achieved;Long Term: Adherence to nutrition and physical activity/exercise program aimed toward attainment of established weight goal;Understanding recommendations for meals to include 15-35% energy as protein, 25-35% energy from fat, 35-60% energy from carbohydrates, less than 200mg  of dietary cholesterol, 20-35 gm of total fiber daily;Understanding of distribution of calorie intake throughout the day with the consumption of 4-5 meals/snacks     Diabetes Yes    Intervention Provide education about signs/symptoms and action to take for hypo/hyperglycemia.;Provide education about proper nutrition, including hydration, and aerobic/resistive exercise prescription along with prescribed medications to achieve blood glucose in normal ranges: Fasting glucose 65-99 mg/dL    Expected Outcomes Short Term: Participant verbalizes understanding of the signs/symptoms and immediate care of hyper/hypoglycemia, proper foot care and importance of medication, aerobic/resistive exercise and nutrition plan for blood glucose control.;Long Term: Attainment of HbA1C < 7%.    Hypertension Yes    Intervention Provide education on lifestyle modifcations including regular physical activity/exercise, weight management, moderate sodium restriction and increased consumption of fresh fruit, vegetables, and low fat dairy, alcohol moderation, and smoking cessation.;Monitor prescription use compliance.    Expected Outcomes Short Term: Continued assessment and intervention until BP is < 140/18mm HG in hypertensive participants. < 130/78mm HG in hypertensive participants with diabetes, heart failure or chronic kidney disease.;Long Term: Maintenance of  blood pressure at goal levels.    Lipids Yes    Intervention Provide education and support for participant on nutrition & aerobic/resistive exercise along with prescribed medications to achieve LDL 70mg , HDL >40mg .    Expected Outcomes Short Term: Participant states understanding of desired cholesterol values and is compliant with medications prescribed. Participant is following exercise prescription and nutrition guidelines.;Long Term: Cholesterol controlled with medications as prescribed, with individualized exercise RX and with personalized nutrition plan. Value goals: LDL < 70mg , HDL > 40 mg.           Core Components/Risk Factors/Patient Goals Review:    Core Components/Risk Factors/Patient Goals at Discharge (Final  Review):    ITP Comments:  ITP Comments    Row Name 12/27/20 1315           ITP Comments Dr Fransico Him MD, Medical Director              Comments:Tom attended orientation on 12/27/2020 to review rules and guidelines for program.  Completed 6 minute walk test, Intitial ITP, and exercise prescription.  VSS. Telemetry-Sinus brady resting heart rate in the mid 40's intermittent PVC's  trigeminal   PVC's noted towards the end of walk test .  Asymptomatic. Safety measures and social distancing in place per CDC guidelines. Today's ECG tracings faxed to Dr Irven Shelling office for review. Dr Einar Gip reviewed today's ECG tracings and said that the patient can proceed with exercise on Monday.Barnet Pall, RN,BSN 12/27/2020 1:39 PM

## 2020-12-27 NOTE — Progress Notes (Addendum)
Cardiac Rehab Medication Review by a Nurse  Does the patient  feel that his/her medications are working for him/her?  yes  Has the patient been experiencing any side effects to the medications prescribed?  no  Does the patient measure his/her own blood pressure or blood glucose at home?  no   Does the patient have any problems obtaining medications due to transportation or finances?   no  Understanding of regimen: good Understanding of indications: good Potential of compliance: good    Nurse comments: Medications reviewed. Patient taking as prescribed.Barnet Pall, RN,BSN 12/27/2020 1:21 PM    Harrell Gave RN BSN 12/27/2020 1:19 PM

## 2020-12-28 NOTE — Telephone Encounter (Signed)
done

## 2020-12-31 ENCOUNTER — Other Ambulatory Visit: Payer: Self-pay

## 2020-12-31 ENCOUNTER — Encounter (HOSPITAL_COMMUNITY)
Admission: RE | Admit: 2020-12-31 | Discharge: 2020-12-31 | Disposition: A | Discharge status: Home / Self Care | Payer: Medicare HMO | Source: Ambulatory Visit | Attending: Cardiology | Admitting: Cardiology

## 2020-12-31 DIAGNOSIS — Z955 Presence of coronary angioplasty implant and graft: Secondary | ICD-10-CM

## 2020-12-31 DIAGNOSIS — I214 Non-ST elevation (NSTEMI) myocardial infarction: Secondary | ICD-10-CM

## 2020-12-31 DIAGNOSIS — I25118 Atherosclerotic heart disease of native coronary artery with other forms of angina pectoris: Secondary | ICD-10-CM | POA: Diagnosis not present

## 2020-12-31 NOTE — Progress Notes (Signed)
Daily Session Note  Patient Details  Name: Anthony Adkins MRN: [id number] Date of Birth: November 06, 1940 Referring Provider:   Flowsheet Row CARDIAC REHAB PHASE II ORIENTATION from 12/27/2020 in [facility]  Referring Provider Dr. Adrian Prows      Encounter Date: 12/31/2020  Check In:  Session Check In - 12/31/20 0858      Check-In   Supervising physician immediately available to respond to emergencies [facility] immediately available    Physician(s) Dr Doristine Bosworth    Location [facility]    Staff Present Barnet Pall, RN, Milus Glazier, MS, EP-C, CCRP;Carlette [place], RN, Deland Pretty, MS, ACSM CEP, Exercise Physiologist;Annedrea Rosezella [place], RN, MHA;Other   Zachary George, Academic Intern   Virtual Visit No    Medication changes reported     No    Fall or balance concerns reported    No    Tobacco Cessation No Change    Current number of cigarettes/nicotine per day     0    Warm-up and Cool-down Performed on first and last piece of equipment    Resistance Training Performed Yes    VAD Patient? No    PAD/SET Patient? No      Pain Assessment   Currently in Pain? No/denies    Pain Score 0-No pain    Multiple Pain Sites No           Capillary Blood Glucose: No results found for this or any previous visit (from the past 24 hour(s)).   Exercise Prescription Changes - 12/31/20 1000      Response to Exercise   Blood Pressure (Admit) 130/60    Blood Pressure (Exercise) 150/70    Blood Pressure (Exit) 126/52    Heart Rate (Admit) 46 bpm    Heart Rate (Exercise) 66 bpm    Heart Rate (Exit) 42 bpm    Rating of Perceived Exertion (Exercise) 11    Symptoms None    Comments Pt's first day in the [facility] program.    Duration Progress to 30 minutes of  aerobic without signs/symptoms of physical distress    Intensity THRR unchanged      Progression   Progression Continue to progress workloads to maintain intensity without  signs/symptoms of physical distress.    Average METs 2.1      Resistance Training   Training Prescription Yes    Weight 4 lbs    Reps 10-15    Time 10 Minutes      Interval Training   Interval Training No      NuStep   Level 3    SPM 75    Minutes 15    METs 2.1      Arm Ergometer   Level 2    Watts 9    RPM 60    Minutes 15           Social History   Tobacco Use  Smoking Status Light Tobacco Smoker  . Types: Cigarettes  Smokeless Tobacco Never Used  Tobacco Comment   Smokes around 5 cigarettes a day    Goals Met:  Exercise tolerated well No report of cardiac concerns or symptoms Strength training completed today  Goals Unmet:  Not Applicable  Comments: Anthony Adkins started cardiac rehab today.  Pt tolerated light exercise without difficulty. VSS, telemetry-Sinus brady, PAC's, asymptomatic.  Medication list reconciled. Pt denies barriers to medicaiton compliance.  PSYCHOSOCIAL ASSESSMENT:  PHQ-0. Pt exhibits positive coping skills, hopeful outlook with supportive  family. No psychosocial needs identified at this time, no psychosocial interventions necessary.    Pt enjoys fishing and volunteering.   Pt oriented to exercise equipment and routine.    Understanding verbalized.Barnet Pall, RN,BSN 01/01/2021 7:57 AM   Dr. Fransico Him is Medical Director for Cardiac Rehab at [facility].

## 2021-01-02 ENCOUNTER — Encounter (HOSPITAL_COMMUNITY): Payer: Medicare HMO

## 2021-01-02 DIAGNOSIS — L821 Other seborrheic keratosis: Secondary | ICD-10-CM | POA: Diagnosis not present

## 2021-01-02 DIAGNOSIS — L570 Actinic keratosis: Secondary | ICD-10-CM | POA: Diagnosis not present

## 2021-01-02 DIAGNOSIS — D225 Melanocytic nevi of trunk: Secondary | ICD-10-CM | POA: Diagnosis not present

## 2021-01-02 DIAGNOSIS — D1801 Hemangioma of skin and subcutaneous tissue: Secondary | ICD-10-CM | POA: Diagnosis not present

## 2021-01-02 DIAGNOSIS — Z85828 Personal history of other malignant neoplasm of skin: Secondary | ICD-10-CM | POA: Diagnosis not present

## 2021-01-04 ENCOUNTER — Encounter (HOSPITAL_COMMUNITY)
Admission: RE | Admit: 2021-01-04 | Discharge: 2021-01-04 | Disposition: A | Discharge status: Home / Self Care | Payer: Medicare HMO | Source: Ambulatory Visit | Attending: Cardiology | Admitting: Cardiology

## 2021-01-04 ENCOUNTER — Other Ambulatory Visit: Payer: Self-pay

## 2021-01-04 VITALS — Wt 174.0 lb

## 2021-01-04 DIAGNOSIS — Z955 Presence of coronary angioplasty implant and graft: Secondary | ICD-10-CM | POA: Diagnosis not present

## 2021-01-04 DIAGNOSIS — I25118 Atherosclerotic heart disease of native coronary artery with other forms of angina pectoris: Secondary | ICD-10-CM | POA: Diagnosis not present

## 2021-01-04 DIAGNOSIS — I214 Non-ST elevation (NSTEMI) myocardial infarction: Secondary | ICD-10-CM

## 2021-01-04 NOTE — Progress Notes (Addendum)
Anthony Adkins 80 y.o. male Nutrition Note  Diagnosis: NSTEMI; PTCA, DES LAD  Past Medical History:  Diagnosis Date  . Coronary artery disease   . Diabetes mellitus without complication ([place])   . Heart murmur    had rheumatic fever as a child  . Hyperlipidemia   . Hypertension      Medications reviewed.   Current Outpatient Medications:  .  atorvastatin (LIPITOR) 40 MG tablet, Take 1 tablet (40 mg total) by mouth daily., Disp: 90 tablet, Rfl: 1 .  BAYER LOW DOSE 81 MG EC tablet, Take 81 mg by mouth at bedtime. Swallow whole., Disp: , Rfl:  .  fluticasone (FLONASE) 50 MCG/ACT nasal spray, Place 2 sprays into both nostrils daily as needed for rhinitis (or seasonal allergies)., Disp: , Rfl:  .  loratadine (CLARITIN) 10 MG tablet, Take 10 mg by mouth daily as needed for rhinitis (or seasonal allergies)., Disp: , Rfl:  .  losartan (COZAAR) 50 MG tablet, Take 1 tablet (50 mg total) by mouth every evening., Disp: 90 tablet, Rfl: 3 .  metoprolol succinate (TOPROL-XL) 50 MG 24 hr tablet, Take 1 tablet (50 mg total) by mouth daily. Take with or immediately following a meal., Disp: 90 tablet, Rfl: 3 .  Multiple Vitamins-Minerals (ONE-A-DAY MENS 50+ ADVANTAGE) TABS, Take 1 tablet by mouth daily with breakfast., Disp: , Rfl:  .  nitroGLYCERIN (NITROSTAT) 0.4 MG SL tablet, Place 1 tablet (0.4 mg total) under the tongue every 5 (five) minutes as needed for chest pain., Disp: 25 tablet, Rfl: 3 .  NON FORMULARY, Take 900 mg by mouth See admin instructions. CBD 900 mg gummie- Chew 1 gummie (900 mg) by mouth at bedtime, Disp: , Rfl:  .  prasugrel (EFFIENT) 10 MG TABS tablet, Take 1 tablet (10 mg total) by mouth daily., Disp: 90 tablet, Rfl: 3   Ht Readings from Last 1 Encounters:  12/27/20 5\' 8"  (1.727 m)     Wt Readings from Last 3 Encounters:  12/27/20 174 lb 13.2 oz (79.3 kg)  12/11/20 178 lb (80.7 kg)  11/01/20 168 lb (76.2 kg)     There is no height or weight on file to calculate BMI.    Social History   Tobacco Use  Smoking Status Light Tobacco Smoker  . Types: Cigarettes  Smokeless Tobacco Never Used  Tobacco Comment   Smokes around 5 cigarettes a day     Lab Results  Component Value Date   CHOL 116 10/18/2020   Lab Results  Component Value Date   HDL 43 10/18/2020   Lab Results  Component Value Date   LDLCALC 62 10/18/2020   Lab Results  Component Value Date   TRIG 57 10/18/2020     Lab Results  Component Value Date   HGBA1C 6.2 (H) 10/17/2020     CBG (last 3)  No results for input(s): GLUCAP in the last 72 hours.   Nutrition Note  Spoke with pt. Nutrition Plan and Nutrition Survey goals reviewed with pt. Pt is following a Heart Healthy diet.   Pt has Type 2 Diabetes. Last A1c indicates blood glucose well-controlled. Pt was diagnosed with diabetes 15 years ago and was taking metformin. He does not take any diabetes medication anymore and does not check CBGs. He tries to eat a healthy diet, exercise most days, and gets A1C checked every 6 months.  Per discussion, pt does not use canned/convenience foods often. Pt does not add salt to food. Pt does not eat out  frequently. He tries to avoid processed foods and frozen foods. No sugary beverages.  He has a vodka cranberry most days. Wine 1x per week.  Pt expressed understanding of the information reviewed.    Nutrition Diagnosis ? Food-and nutrition-related knowledge deficit related to lack of exposure to information as related to diagnosis of: ? CVD ? type 2 diabetes  Nutrition Intervention ? Pt's individual nutrition plan reviewed with pt. ? Benefits of adopting Heart Healthy diet discussed when Picture Your Plate reviewed.   ? Continue client-centered nutrition education by RD, as part of interdisciplinary care.  Goal(s)  ? Pt to build a healthy plate including vegetables, fruits, whole grains, and low-fat dairy products in a heart healthy meal plan.  Plan:    Will provide  client-centered nutrition education as part of interdisciplinary care  Monitor and evaluate progress toward nutrition goal with team.   Michaele Offer, MS, RDN, LDN

## 2021-01-07 ENCOUNTER — Other Ambulatory Visit: Payer: Self-pay

## 2021-01-07 ENCOUNTER — Encounter (HOSPITAL_COMMUNITY)
Admission: RE | Admit: 2021-01-07 | Discharge: 2021-01-07 | Disposition: A | Discharge status: Home / Self Care | Payer: Medicare HMO | Source: Ambulatory Visit | Attending: Cardiology | Admitting: Cardiology

## 2021-01-07 ENCOUNTER — Other Ambulatory Visit: Payer: Self-pay | Admitting: Podiatry

## 2021-01-07 DIAGNOSIS — I25118 Atherosclerotic heart disease of native coronary artery with other forms of angina pectoris: Secondary | ICD-10-CM | POA: Diagnosis not present

## 2021-01-07 DIAGNOSIS — Z955 Presence of coronary angioplasty implant and graft: Secondary | ICD-10-CM | POA: Diagnosis not present

## 2021-01-07 DIAGNOSIS — I214 Non-ST elevation (NSTEMI) myocardial infarction: Secondary | ICD-10-CM

## 2021-01-09 ENCOUNTER — Other Ambulatory Visit: Payer: Self-pay

## 2021-01-09 ENCOUNTER — Encounter (HOSPITAL_COMMUNITY)
Admission: RE | Admit: 2021-01-09 | Discharge: 2021-01-09 | Disposition: A | Discharge status: Home / Self Care | Payer: Medicare HMO | Source: Ambulatory Visit | Attending: Cardiology | Admitting: Cardiology

## 2021-01-09 DIAGNOSIS — I25118 Atherosclerotic heart disease of native coronary artery with other forms of angina pectoris: Secondary | ICD-10-CM | POA: Diagnosis not present

## 2021-01-09 DIAGNOSIS — Z955 Presence of coronary angioplasty implant and graft: Secondary | ICD-10-CM | POA: Diagnosis not present

## 2021-01-09 DIAGNOSIS — I214 Non-ST elevation (NSTEMI) myocardial infarction: Secondary | ICD-10-CM

## 2021-01-11 ENCOUNTER — Encounter (HOSPITAL_COMMUNITY): Payer: Medicare HMO

## 2021-01-14 ENCOUNTER — Encounter (HOSPITAL_COMMUNITY): Payer: Medicare HMO

## 2021-01-14 ENCOUNTER — Other Ambulatory Visit: Payer: Self-pay

## 2021-01-14 ENCOUNTER — Encounter (HOSPITAL_COMMUNITY)
Admission: RE | Admit: 2021-01-14 | Discharge: 2021-01-14 | Disposition: A | Discharge status: Home / Self Care | Payer: Medicare HMO | Source: Ambulatory Visit | Attending: Cardiology | Admitting: Cardiology

## 2021-01-14 DIAGNOSIS — I25118 Atherosclerotic heart disease of native coronary artery with other forms of angina pectoris: Secondary | ICD-10-CM | POA: Diagnosis not present

## 2021-01-14 DIAGNOSIS — I214 Non-ST elevation (NSTEMI) myocardial infarction: Secondary | ICD-10-CM

## 2021-01-14 DIAGNOSIS — Z955 Presence of coronary angioplasty implant and graft: Secondary | ICD-10-CM | POA: Diagnosis not present

## 2021-01-16 ENCOUNTER — Other Ambulatory Visit: Payer: Self-pay

## 2021-01-16 ENCOUNTER — Encounter (HOSPITAL_COMMUNITY)
Admission: RE | Admit: 2021-01-16 | Discharge: 2021-01-16 | Disposition: A | Discharge status: Home / Self Care | Payer: Medicare HMO | Source: Ambulatory Visit | Attending: Cardiology | Admitting: Cardiology

## 2021-01-16 DIAGNOSIS — Z955 Presence of coronary angioplasty implant and graft: Secondary | ICD-10-CM | POA: Diagnosis not present

## 2021-01-16 DIAGNOSIS — I25118 Atherosclerotic heart disease of native coronary artery with other forms of angina pectoris: Secondary | ICD-10-CM | POA: Diagnosis not present

## 2021-01-16 DIAGNOSIS — I214 Non-ST elevation (NSTEMI) myocardial infarction: Secondary | ICD-10-CM

## 2021-01-17 NOTE — Progress Notes (Signed)
Reviewed the home exercise Rx today with patient. Pt is currently walking at home but was encouraged to increase the time to 3045 minutes 2-3 x/week. Encouraged warm-up, cool-down, and stretching. Reviewed THRR of 56-113 and keeping RPE between 11-13. Hydration encouraged with activity. Reviewed weather parameters for temperature and humidify for safe exercise outdoors. Reviewed S/S to terminate exercise and when to call 911 vs MD. Reviewed use of NTG by teach-back method and encouraged to carry at all times. Pt verbalized understanding of the home exercise Rx and was provided a copy.   Lesly Rubenstein MS, ACSM-CEP, CCRP

## 2021-01-18 ENCOUNTER — Other Ambulatory Visit: Payer: Self-pay

## 2021-01-18 ENCOUNTER — Encounter (HOSPITAL_COMMUNITY)
Admission: RE | Admit: 2021-01-18 | Discharge: 2021-01-18 | Disposition: A | Discharge status: Home / Self Care | Payer: Medicare HMO | Source: Ambulatory Visit | Attending: Cardiology | Admitting: Cardiology

## 2021-01-18 DIAGNOSIS — I25118 Atherosclerotic heart disease of native coronary artery with other forms of angina pectoris: Secondary | ICD-10-CM | POA: Diagnosis not present

## 2021-01-18 DIAGNOSIS — I214 Non-ST elevation (NSTEMI) myocardial infarction: Secondary | ICD-10-CM

## 2021-01-18 DIAGNOSIS — Z955 Presence of coronary angioplasty implant and graft: Secondary | ICD-10-CM

## 2021-01-21 ENCOUNTER — Encounter (HOSPITAL_COMMUNITY): Payer: Medicare HMO

## 2021-01-21 ENCOUNTER — Telehealth (HOSPITAL_COMMUNITY): Payer: Self-pay | Admitting: *Deleted

## 2021-01-21 NOTE — Telephone Encounter (Signed)
Anthony Adkins brought a letter by and said that the program was beneficial to him. Anthony Adkins said that he prefers to prefers to exercise independently and will respectfully like to withdraw from the program. Anthony Adkins attended 7 exercise sessions between 12/31/20-01/18/21. Anthony Adkins did well with exercise while in attendance.Barnet Pall, RN,BSN 01/21/2021 8:45 AM

## 2021-01-22 ENCOUNTER — Telehealth: Payer: Self-pay | Admitting: Cardiology

## 2021-01-23 ENCOUNTER — Encounter (HOSPITAL_COMMUNITY): Payer: Medicare HMO

## 2021-01-25 ENCOUNTER — Encounter (HOSPITAL_COMMUNITY): Payer: Medicare HMO

## 2021-01-28 ENCOUNTER — Encounter (HOSPITAL_COMMUNITY): Payer: Medicare HMO

## 2021-01-30 ENCOUNTER — Encounter (HOSPITAL_COMMUNITY): Payer: Medicare HMO

## 2021-02-01 ENCOUNTER — Encounter (HOSPITAL_COMMUNITY): Payer: Medicare HMO

## 2021-02-04 ENCOUNTER — Encounter (HOSPITAL_COMMUNITY): Payer: Medicare HMO

## 2021-02-04 NOTE — Progress Notes (Deleted)
Primary Physician/Referring:  Velna Hatchet, MD  Patient ID: EDUIN FRIEDEL, male    DOB: 12/03/1940, 80 y.o.   MRN: [id number]  No chief complaint on file.  HPI:    BLAS RICHES  is a 80 y.o. Caucasian male with hypertension, hyperlipidemia,pre-diabetes mellitus, presented with NSTEMI on 10/17/2020, underwent cardiac catheterization and stenting to the proximal LAD.   ***Patient presents for 8 week follow up of hypertension and CAD. At last visit increase losartan to 50 mg daily, repeat BMP was stable.   ***  Patient presents for 6 week follow up of coronary artery disease. At last visit added losartan and reduced Lipitor from 80 mg to 40 mg daily. Also switched patient from Brilinta to Effient due to cost constraints.  He is tolerating guideline directed medical therapy without issue.  He has had no recurrence of angina since hospitalization and continues to refrain from use.  Patient has slowly been increasing his walking, currently walking about 5000 steps per day without issue.  He is scheduled to start cardiac rehab March 3rd. Denies chest pain, palpitations, leg swelling, syncope, near-syncope. He does continue to have mild, stable dyspnea on exertion. Virtually patient does not monitor his blood pressure on a regular basis at home.  Past Medical History:  Diagnosis Date  . Coronary artery disease   . Diabetes mellitus without complication ([place])   . Heart murmur    had rheumatic fever as a child  . Hyperlipidemia   . Hypertension    Past Surgical History:  Procedure Laterality Date  . CARDIAC CATHETERIZATION    . COLONOSCOPY  2009  . CORONARY STENT INTERVENTION N/A 10/18/2020   Procedure: CORONARY STENT INTERVENTION;  Surgeon: Adrian Prows, MD;  Location: [facility];  Service: Cardiovascular;  Laterality: N/A;  . INGUINAL HERNIA REPAIR     left side/over 20 years ago  . LEFT HEART CATH AND CORONARY ANGIOGRAPHY N/A 10/18/2020   Procedure: LEFT HEART CATH AND  CORONARY ANGIOGRAPHY;  Surgeon: Adrian Prows, MD;  Location: [facility];  Service: Cardiovascular;  Laterality: N/A;  . lymph nodes     removed left side neck due to cat scratch fever   Family History  Problem Relation Age of Onset  . Hypertension Father   . Heart disease Father   . Colon cancer Neg Hx   . Esophageal cancer Neg Hx   . Rectal cancer Neg Hx   . Stomach cancer Neg Hx     Social History   Tobacco Use  . Smoking status: Light Tobacco Smoker    Types: Cigarettes  . Smokeless tobacco: Never Used  . Tobacco comment: Smokes around 5 cigarettes a day  Substance Use Topics  . Alcohol use: Yes    Alcohol/week: 7.0 - 10.0 standard drinks    Types: 7 - 10 Standard drinks or equivalent per week   Marital Status: Married  ROS  Review of Systems  Constitutional: Negative for malaise/fatigue and weight gain.  Cardiovascular: Positive for dyspnea on exertion (mild and stable). Negative for chest pain, claudication, leg swelling, near-syncope, orthopnea, palpitations, paroxysmal nocturnal dyspnea and syncope.  Respiratory: Negative for shortness of breath.   Hematologic/Lymphatic: Does not bruise/bleed easily.  Gastrointestinal: Negative for melena.  Neurological: Negative for dizziness and weakness.   Objective  There were no vitals taken for this visit.  Vitals with BMI 01/04/2021 12/27/2020 12/11/2020  Height - 5\' 8"  5\' 9"   Weight 174 lbs 174 lbs 13 oz 178 lbs  BMI 26.46 03.50 09.38  Systolic - 182 993  Diastolic - 60 70  Pulse - - 53     Physical Exam HENT:     Head: Atraumatic.  Cardiovascular:     Rate and Rhythm: Normal rate and regular rhythm.     Pulses: Intact distal pulses.          Carotid pulses are on the right side with bruit and on the left side with bruit.      Dorsalis pedis pulses are 2+ on the right side and 2+ on the left side.       Posterior tibial pulses are 2+ on the right side and 2+ on the left side.     Heart sounds: S1 normal and S2  normal. Murmur heard.   Midsystolic murmur is present with a grade of 2/6 at the upper right sternal border. No gallop.      Comments: No edema. No JVD.  Pulmonary:     Effort: Pulmonary effort is normal.     Breath sounds: Normal breath sounds.  Skin:    General: Skin is warm and dry.  Neurological:     General: No focal deficit present.     Mental Status: He is oriented to person, place, and time.  Psychiatric:        Mood and Affect: Mood normal.        Behavior: Behavior normal.    Laboratory examination:   Recent Labs    10/17/20 1616 12/24/20 0907  NA 138 139  K 4.1 4.9  CL 103 100  CO2 25 25  GLUCOSE 129* 136*  BUN 11 12  CREATININE 0.94 0.92  CALCIUM 8.9 9.1  GFRNONAA >60  --    CrCl cannot be calculated (Patient's most recent lab result is older than the maximum 21 days allowed.).  CMP Latest Ref Rng & Units 12/24/2020 10/17/2020  Glucose 65 - 99 mg/dL 136(H) 129(H)  BUN 8 - 27 mg/dL 12 11  Creatinine 0.76 - 1.27 mg/dL 0.92 0.94  Sodium 134 - 144 mmol/L 139 138  Potassium 3.5 - 5.2 mmol/L 4.9 4.1  Chloride 96 - 106 mmol/L 100 103  CO2 20 - 29 mmol/L 25 25  Calcium 8.6 - 10.2 mg/dL 9.1 8.9   CBC Latest Ref Rng & Units 10/17/2020  WBC 4.0 - 10.5 K/uL 11.7(H)  Hemoglobin 13.0 - 17.0 g/dL 14.2  Hematocrit 39.0 - 52.0 % 42.3  Platelets 150 - 400 K/uL 234    Lipid Panel Recent Labs    10/18/20 0118  CHOL 116  TRIG 57  LDLCALC 62  VLDL 11  HDL 43  CHOLHDL 2.7    HEMOGLOBIN A1C Lab Results  Component Value Date   HGBA1C 6.2 (H) 10/17/2020   MPG 131.24 10/17/2020   TSH No results for input(s): TSH in the last 8760 hours.  External labs:   11/02/2020: Glucose 137, creatinine 0.8, GFR 93.3, sodium 130, potassium 4.6, BUN 14, CMP otherwise normal Hemoglobin 15.8, hematocrit 46.5, MCV 95.9 daily to 12, CBC otherwise normal Cholesterol 100 from triglycerides 62, HDL 35, LDL 53, non-HDL 65 Lipoprotein B 60.0 TSH 0.91 A1c 6.2%   Medications and  allergies   Allergies  Allergen Reactions  . Sulfa Antibiotics Anaphylaxis, Shortness Of Breath, Rash and Other (See Comments)    Throat and the neck both swell     Outpatient Medications Prior to Visit  Medication Sig Dispense Refill  . atorvastatin (LIPITOR) 40 MG tablet Take 1 tablet (  40 mg total) by mouth daily. 90 tablet 1  . BAYER LOW DOSE 81 MG EC tablet Take 81 mg by mouth at bedtime. Swallow whole.    . fluticasone (FLONASE) 50 MCG/ACT nasal spray Place 2 sprays into both nostrils daily as needed for rhinitis (or seasonal allergies).    . loratadine (CLARITIN) 10 MG tablet Take 10 mg by mouth daily as needed for rhinitis (or seasonal allergies).    . losartan (COZAAR) 50 MG tablet Take 1 tablet (50 mg total) by mouth every evening. 90 tablet 3  . meloxicam (MOBIC) 15 MG tablet TAKE 1 TABLET BY MOUTH EVERY DAY 30 tablet 3  . metoprolol succinate (TOPROL-XL) 50 MG 24 hr tablet Take 1 tablet (50 mg total) by mouth daily. Take with or immediately following a meal. 90 tablet 3  . Multiple Vitamins-Minerals (ONE-A-DAY MENS 50+ ADVANTAGE) TABS Take 1 tablet by mouth daily with breakfast.    . nitroGLYCERIN (NITROSTAT) 0.4 MG SL tablet PLACE 1 TABLET (0.4 MG TOTAL) UNDER THE TONGUE EVERY FIVE MINUTES AS NEEDED FOR CHEST PAIN. 25 tablet 3  . NON FORMULARY Take 900 mg by mouth See admin instructions. CBD 900 mg gummie- Chew 1 gummie (900 mg) by mouth at bedtime    . prasugrel (EFFIENT) 10 MG TABS tablet Take 1 tablet (10 mg total) by mouth daily. 90 tablet 3   No facility-administered medications prior to visit.    Radiology:   No results found.  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 Stenosis in the right external carotid artery (<50%).  Doppler velocity suggests stenosis in the left internal carotid artery (50-69%).  Stenosis in the left external carotid artery (<50%).  Antegrade right vertebral artery flow.  Antegrade left vertebral artery flow.  Follow up in six months is appropriate if clinically indicated.  PCV ECHOCARDIOGRAM COMPLETE 16/07/9603 Normal LV systolic function with visual EF 55-60%. Left ventricle cavity is normal in size. Mild left ventricular hypertrophy. Normal global wall motion. Indeterminate diastolic filling pattern, normal LAP. Left atrial cavity is severely dilated. Aortic valve sclerosis without stenosis. Mild (Grade I) aortic regurgitation. Mild (Grade I) mitral regurgitation. Mild to moderate tricuspid regurgitation. No evidence of pulmonary hypertension. Mild pulmonic regurgitation. IVC is dilated with respiratory variation. No prior study for comparison.   Left Heart Catheterization 10/18/20:  LV: Normal LVEF, EF around 50 to 55%.  Normal EDP. RCA: Dominant.  Mild diffuse disease. LM: Mild calcification noted.  No stenosis. CX: Large vessel.  Gives origin to a large OM1, continues in the AV groove is a small vessel.  Mild diffuse disease noted. LAD: Gives origin to a small D1 following which there is a proximal LAD long tubular 80% stenosis which extends into D2, LAD itself ends immediately after giving origin to D2.  D2 is very large and essentially LAD equivalent.  Successful stenting with 3.5 x 26 mm resolute Onyx, stenosis reduced from 80% in the LAD and about 90% in the ostial D2 which is LAD equavalent to 0% with TIMI-3 to TIMI-3 flow.  Recommendation: Patient can be discharged home today.  Strict instructions regarding smoking cessation has been discussed with the patient.  He will need Brilinta 90 mg p.o. twice daily along with aspirin 81 mg daily for at least a period of 1 year in view of unstable angina.  High intensity statin with atorvastatin 80 mg, beta-blocker metoprolol succinate 50 mg daily for hypertension and CAD will also  be prescribed.  150 mL contrast utilized.    EKG:   EKG 11/01/2020: Normal sinus rhythm at rate of 60 bpm, left axis  deviation, left anterior fascicular block.  Right bundle branch block.  Bifascicular block.  LVH.   Compared to 10/17/2020, PVCs not present.  Assessment     ICD-10-CM   1. Primary hypertension  I10   2. Coronary artery disease of native artery of native heart with stable angina pectoris ([place])  I25.118      There are no discontinued medications.  No orders of the defined types were placed in this encounter.  Recommendations:   JERRICK FARVE is a 80 y.o. Caucasian male with hypertension, hyperlipidemia, pre-diabetes mellitus, presented with NSTEMI on 10/17/2020, underwent cardiac catheterization and stenting to the proximal LAD.   ***Patient presents for 8 week follow up of hypertension and CAD. At last visit increase losartan to 50 mg daily, repeat BMP was stable.   ***  ***  Patient presents for 6 week follow up of coronary artery disease. He is presently doing well, without recurrence of angina and no clinical signs of heart failure. I personally reviewed external labs, his lipids are under excellent control and renal function is stable. I reviewed and discussed results of echocardiogram and carotid artery duplex with patient, details above. Will repeat carotid artery duplex in 6 months. 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   In regard to hypertension, blood pressure remains uncontrolled. Will increase losartan from 25 mg to 50 mg daily and repeat BMP in 7-10 days. Advised patient to monitor his blood pressure daily at home.   Follow up in 8 weeks, sooner if needed, for hypertension and CAD.    Alethia Berthold, PA-C 02/04/2021, 12:30 PM Office: [phone]

## 2021-02-05 ENCOUNTER — Ambulatory Visit: Payer: Medicare HMO | Admitting: Student

## 2021-02-05 DIAGNOSIS — I25118 Atherosclerotic heart disease of native coronary artery with other forms of angina pectoris: Secondary | ICD-10-CM

## 2021-02-05 DIAGNOSIS — I10 Essential (primary) hypertension: Secondary | ICD-10-CM

## 2021-02-06 ENCOUNTER — Encounter (HOSPITAL_COMMUNITY): Payer: Medicare HMO

## 2021-02-08 ENCOUNTER — Encounter (HOSPITAL_COMMUNITY): Payer: Medicare HMO

## 2021-02-08 NOTE — Progress Notes (Signed)
Primary Physician/Referring:  Velna Hatchet, MD  Patient ID: Anthony Adkins, male    DOB: 1941-10-18, 80 y.o.   MRN: [id number]  Chief Complaint  Patient presents with  . Coronary Artery Disease  . Hypertension  . Follow-up    8 week   HPI:    Anthony Adkins  is an 80 y.o. Caucasian male with hypertension, hyperlipidemia,pre-diabetes mellitus, presented with NSTEMI on 10/17/2020, underwent cardiac catheterization and stenting to the proximal LAD.   Patient presents for 8 week follow up of hypertension and CAD. At last visit increased losartan to 50 mg daily, repeat BMP was stable.  Patient has tolerated increased dose of losartan without issue.  Since last visit patient attended several sessions of cardiac rehab, and states that this increase his confidence in increasing his exercise on his own.  Patient is now walking 5 to 6 days/week without issue.  He states that he has noticed his blood pressure is elevated in the mornings, however improved and is well controlled following his medications which he takes around 8-9 AM.  Patient is tolerating guideline directed medical therapy without issue, he has had no recurrence of angina.  Patient denies chest pain, dyspnea, palpitations, syncope, near syncope, leg swelling.  Past Medical History:  Diagnosis Date  . Coronary artery disease   . Diabetes mellitus without complication ([place])   . Heart murmur    had rheumatic fever as a child  . Hyperlipidemia   . Hypertension    Past Surgical History:  Procedure Laterality Date  . CARDIAC CATHETERIZATION    . COLONOSCOPY  2009  . CORONARY STENT INTERVENTION N/A 10/18/2020   Procedure: CORONARY STENT INTERVENTION;  Surgeon: Adrian Prows, MD;  Location: [facility];  Service: Cardiovascular;  Laterality: N/A;  . INGUINAL HERNIA REPAIR     left side/over 20 years ago  . LEFT HEART CATH AND CORONARY ANGIOGRAPHY N/A 10/18/2020   Procedure: LEFT HEART CATH AND CORONARY ANGIOGRAPHY;  Surgeon:  Adrian Prows, MD;  Location: [facility];  Service: Cardiovascular;  Laterality: N/A;  . lymph nodes     removed left side neck due to cat scratch fever   Family History  Problem Relation Age of Onset  . Hypertension Father   . Heart disease Father   . Colon cancer Neg Hx   . Esophageal cancer Neg Hx   . Rectal cancer Neg Hx   . Stomach cancer Neg Hx     Social History   Tobacco Use  . Smoking status: Former Smoker    Packs/day: 0.25    Years: 10.00    Pack years: 2.50    Types: Cigarettes    Quit date: 10/18/2020    Years since quitting: 0.3  . Smokeless tobacco: Never Used  . Tobacco comment: Smokes around 5 cigarettes a day  Substance Use Topics  . Alcohol use: Yes    Alcohol/week: 7.0 - 10.0 standard drinks    Types: 7 - 10 Standard drinks or equivalent per week   Marital Status: Married  ROS  Review of Systems  Constitutional: Negative for malaise/fatigue and weight gain.  Cardiovascular: Negative for chest pain, claudication, leg swelling, near-syncope, orthopnea, palpitations, paroxysmal nocturnal dyspnea and syncope. Dyspnea on exertion: improving.  Respiratory: Negative for shortness of breath.   Hematologic/Lymphatic: Does not bruise/bleed easily.  Gastrointestinal: Negative for melena.  Neurological: Negative for dizziness and weakness.   Objective  Blood pressure 140/74, pulse (!) 52, temperature (!) 97.1 F (36.2 C),  resp. rate 16, height 5\' 8"  (1.727 m), weight 176 lb (79.8 kg), SpO2 97 %.  Vitals with BMI 02/11/2021 01/04/2021 12/27/2020  Height 5\' 8"  - 5\' 8"   Weight 176 lbs 174 lbs 174 lbs 13 oz  BMI 26.77 41.74 08.14  Systolic 481 - 856  Diastolic 74 - 60  Pulse 52 - -     Physical Exam HENT:     Head: Atraumatic.  Cardiovascular:     Rate and Rhythm: Normal rate and regular rhythm.     Pulses: Intact distal pulses.          Carotid pulses are on the right side with bruit and on the left side with bruit.      Dorsalis pedis pulses are 2+ on  the right side and 2+ on the left side.       Posterior tibial pulses are 2+ on the right side and 2+ on the left side.     Heart sounds: S1 normal and S2 normal. Murmur heard.   Midsystolic murmur is present with a grade of 2/6 at the upper right sternal border. No gallop.      Comments: No edema. No JVD.  Pulmonary:     Effort: Pulmonary effort is normal.     Breath sounds: Normal breath sounds. No wheezing or rales.  Skin:    General: Skin is warm and dry.  Neurological:     General: No focal deficit present.     Mental Status: He is oriented to person, place, and time.    Laboratory examination:   Recent Labs    10/17/20 1616 12/24/20 0907  NA 138 139  K 4.1 4.9  CL 103 100  CO2 25 25  GLUCOSE 129* 136*  BUN 11 12  CREATININE 0.94 0.92  CALCIUM 8.9 9.1  GFRNONAA >60  --    CrCl cannot be calculated (Patient's most recent lab result is older than the maximum 21 days allowed.).  CMP Latest Ref Rng & Units 12/24/2020 10/17/2020  Glucose 65 - 99 mg/dL 136(H) 129(H)  BUN 8 - 27 mg/dL 12 11  Creatinine 0.76 - 1.27 mg/dL 0.92 0.94  Sodium 134 - 144 mmol/L 139 138  Potassium 3.5 - 5.2 mmol/L 4.9 4.1  Chloride 96 - 106 mmol/L 100 103  CO2 20 - 29 mmol/L 25 25  Calcium 8.6 - 10.2 mg/dL 9.1 8.9   CBC Latest Ref Rng & Units 10/17/2020  WBC 4.0 - 10.5 K/uL 11.7(H)  Hemoglobin 13.0 - 17.0 g/dL 14.2  Hematocrit 39.0 - 52.0 % 42.3  Platelets 150 - 400 K/uL 234    Lipid Panel Recent Labs    10/18/20 0118  CHOL 116  TRIG 57  LDLCALC 62  VLDL 11  HDL 43  CHOLHDL 2.7    HEMOGLOBIN A1C Lab Results  Component Value Date   HGBA1C 6.2 (H) 10/17/2020   MPG 131.24 10/17/2020   TSH No results for input(s): TSH in the last 8760 hours.  External labs:   11/02/2020: Glucose 137, creatinine 0.8, GFR 93.3, sodium 130, potassium 4.6, BUN 14, CMP otherwise normal Hemoglobin 15.8, hematocrit 46.5, MCV 95.9 daily to 12, CBC otherwise normal Cholesterol 100 from triglycerides  62, HDL 35, LDL 53, non-HDL 65 Lipoprotein B 60.0 TSH 0.91 A1c 6.2%   Medications and allergies   Allergies  Allergen Reactions  . Sulfa Antibiotics Anaphylaxis, Shortness Of Breath, Rash and Other (See Comments)    Throat and the neck both swell  Outpatient Medications Prior to Visit  Medication Sig Dispense Refill  . atorvastatin (LIPITOR) 40 MG tablet Take 1 tablet (40 mg total) by mouth daily. 90 tablet 1  . BAYER LOW DOSE 81 MG EC tablet Take 81 mg by mouth at bedtime. Swallow whole.    . fluticasone (FLONASE) 50 MCG/ACT nasal spray Place 2 sprays into both nostrils daily as needed for rhinitis (or seasonal allergies).    . loratadine (CLARITIN) 10 MG tablet Take 10 mg by mouth daily as needed for rhinitis (or seasonal allergies).    . losartan (COZAAR) 50 MG tablet Take 1 tablet (50 mg total) by mouth every evening. 90 tablet 3  . meloxicam (MOBIC) 15 MG tablet TAKE 1 TABLET BY MOUTH EVERY DAY 30 tablet 3  . Multiple Vitamins-Minerals (ONE-A-DAY MENS 50+ ADVANTAGE) TABS Take 1 tablet by mouth daily with breakfast.    . NON FORMULARY Take 900 mg by mouth See admin instructions. CBD 900 mg gummie- Chew 1 gummie (900 mg) by mouth at bedtime    . prasugrel (EFFIENT) 10 MG TABS tablet Take 1 tablet (10 mg total) by mouth daily. 90 tablet 3  . metoprolol succinate (TOPROL-XL) 50 MG 24 hr tablet Take 1 tablet (50 mg total) by mouth daily. Take with or immediately following a meal. 90 tablet 3  . nitroGLYCERIN (NITROSTAT) 0.4 MG SL tablet PLACE 1 TABLET (0.4 MG TOTAL) UNDER THE TONGUE EVERY FIVE MINUTES AS NEEDED FOR CHEST PAIN. 25 tablet 3   No facility-administered medications prior to visit.    Radiology:   No results found.  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 Stenosis in the right external carotid artery (<50%).  Doppler velocity suggests stenosis in the left internal carotid artery (50-69%).   Stenosis in the left external carotid artery (<50%).  Antegrade right vertebral artery flow. Antegrade left vertebral artery flow.  Follow up in six months is appropriate if clinically indicated.  PCV ECHOCARDIOGRAM COMPLETE 03/50/0938 Normal LV systolic function with visual EF 55-60%. Left ventricle cavity is normal in size. Mild left ventricular hypertrophy. Normal global wall motion. Indeterminate diastolic filling pattern, normal LAP. Left atrial cavity is severely dilated. Aortic valve sclerosis without stenosis. Mild (Grade I) aortic regurgitation. Mild (Grade I) mitral regurgitation. Mild to moderate tricuspid regurgitation. No evidence of pulmonary hypertension. Mild pulmonic regurgitation. IVC is dilated with respiratory variation. No prior study for comparison.   Left Heart Catheterization 10/18/20:  LV: Normal LVEF, EF around 50 to 55%.  Normal EDP. RCA: Dominant.  Mild diffuse disease. LM: Mild calcification noted.  No stenosis. CX: Large vessel.  Gives origin to a large OM1, continues in the AV groove is a small vessel.  Mild diffuse disease noted. LAD: Gives origin to a small D1 following which there is a proximal LAD long tubular 80% stenosis which extends into D2, LAD itself ends immediately after giving origin to D2.  D2 is very large and essentially LAD equivalent.  Successful stenting with 3.5 x 26 mm resolute Onyx, stenosis reduced from 80% in the LAD and about 90% in the ostial D2 which is LAD equavalent to 0% with TIMI-3 to TIMI-3 flow.  Recommendation: Patient can be discharged home today.  Strict instructions regarding smoking cessation has been discussed with the patient.  He will need Brilinta 90 mg p.o. twice daily along with aspirin 81 mg daily for at least a period of 1 year in view of unstable angina.  High intensity statin with atorvastatin 80 mg, beta-blocker metoprolol succinate 50 mg daily for hypertension and CAD will also be prescribed.  150 mL contrast  utilized.  EKG:   EKG 11/01/2020: Normal sinus rhythm at rate of 60 bpm, left axis deviation, left anterior fascicular block.  Right bundle branch block.  Bifascicular block.  LVH.   Compared to 10/17/2020, PVCs not present.  Assessment     ICD-10-CM   1. Coronary artery disease of native artery of native heart with stable angina pectoris (HCC)  I25.118 metoprolol succinate (TOPROL-XL) 50 MG 24 hr tablet  2. Primary hypertension  I10      Medications Discontinued During This Encounter  Medication Reason  . metoprolol succinate (TOPROL-XL) 50 MG 24 hr tablet   . nitroGLYCERIN (NITROSTAT) 0.4 MG SL tablet Reorder    Meds ordered this encounter  Medications  . metoprolol succinate (TOPROL-XL) 50 MG 24 hr tablet    Sig: Take 1 tablet (50 mg total) by mouth every evening. Take with or immediately following a meal.    Dispense:  90 tablet    Refill:  3  . nitroGLYCERIN (NITROSTAT) 0.4 MG SL tablet    Sig: PLACE 1 TABLET (0.4 MG TOTAL) UNDER THE TONGUE EVERY FIVE MINUTES AS NEEDED FOR CHEST PAIN.    Dispense:  25 tablet    Refill:  3   Recommendations:   Anthony Adkins is a 80 y.o. Caucasian male with hypertension, hyperlipidemia, pre-diabetes mellitus, presented with NSTEMI on 10/17/2020, underwent cardiac catheterization and stenting to the proximal LAD.   Patient presents for 8 week follow up of hypertension and CAD. At last visit increase losartan to 50 mg daily, repeat BMP was stable.  Patient is tolerating guideline directed medical therapy without issue, will continue this.  We will plan to continue moderate dose of beta-blocker therapy, in view of his bifascicular block on EKG would not recommend higher dose.  #2 hypertension, blood pressure was elevated in the office initially, however upon recheck it improved.  Advised patient to take metoprolol in the evenings and losartan in the morning to improve blood pressure control.  Patient will notify our office if his blood pressure  remains >130/80 mmHg.  We will schedule patient for surveillance carotid artery scan.  Patient is otherwise stable from a cardiovascular point.  Follow-up in 6 months, sooner if needed, for CAD, hypertension, hyperlipidemia.   Alethia Berthold, PA-C 02/11/2021, 9:16 AM Office: [phone]

## 2021-02-11 ENCOUNTER — Other Ambulatory Visit: Payer: Self-pay

## 2021-02-11 ENCOUNTER — Encounter (HOSPITAL_COMMUNITY): Payer: Medicare HMO

## 2021-02-11 ENCOUNTER — Ambulatory Visit: Payer: Medicare HMO | Admitting: Student

## 2021-02-11 ENCOUNTER — Encounter: Payer: Self-pay | Admitting: Student

## 2021-02-11 VITALS — BP 140/74 | HR 52 | Temp 97.1°F | Resp 16 | Ht 68.0 in | Wt 176.0 lb

## 2021-02-11 DIAGNOSIS — I25118 Atherosclerotic heart disease of native coronary artery with other forms of angina pectoris: Secondary | ICD-10-CM | POA: Diagnosis not present

## 2021-02-11 DIAGNOSIS — I10 Essential (primary) hypertension: Secondary | ICD-10-CM | POA: Diagnosis not present

## 2021-02-11 MED ORDER — METOPROLOL SUCCINATE ER 50 MG PO TB24: 50.0000 mg | ORAL_TABLET | Freq: Every evening | ORAL | 3 refills | Status: DC

## 2021-02-11 MED ORDER — NITROGLYCERIN 0.4 MG SL SUBL
SUBLINGUAL_TABLET | SUBLINGUAL | 3 refills | Status: AC | PRN
Start: 2021-02-11 — End: 2022-02-11

## 2021-02-11 NOTE — Patient Instructions (Signed)
Notify office if blood pressure remains >140/90 mmHg  Take metoprolol in the evening and losartan in the morning.

## 2021-02-13 ENCOUNTER — Encounter (HOSPITAL_COMMUNITY): Payer: Medicare HMO

## 2021-02-14 ENCOUNTER — Other Ambulatory Visit: Payer: Self-pay | Admitting: Cardiology

## 2021-02-14 DIAGNOSIS — E7800 Pure hypercholesterolemia, unspecified: Secondary | ICD-10-CM

## 2021-02-15 ENCOUNTER — Encounter (HOSPITAL_COMMUNITY): Payer: Medicare HMO

## 2021-02-18 ENCOUNTER — Encounter (HOSPITAL_COMMUNITY): Payer: Medicare HMO

## 2021-02-19 NOTE — Telephone Encounter (Signed)
Cardiac rehab notes reviewed

## 2021-02-20 ENCOUNTER — Encounter (HOSPITAL_COMMUNITY): Payer: Medicare HMO

## 2021-02-22 ENCOUNTER — Encounter (HOSPITAL_COMMUNITY): Payer: Medicare HMO

## 2021-03-21 ENCOUNTER — Other Ambulatory Visit: Payer: Self-pay

## 2021-03-21 ENCOUNTER — Ambulatory Visit: Payer: Medicare HMO

## 2021-03-21 DIAGNOSIS — I6523 Occlusion and stenosis of bilateral carotid arteries: Secondary | ICD-10-CM | POA: Diagnosis not present

## 2021-03-25 NOTE — Progress Notes (Signed)
Carotid artery duplex 03/21/2021: Bilateral external carotid artery (<50%) with homogeneous plaque probable source of bruit. Antegrade right vertebral artery flow. Antegrade left vertebral artery flow. Compared to 11/22/2020, left ICA stenosis of 50-69% stenosis not noted, otherwise no significant change. Follow up in one year for stability.

## 2021-03-27 ENCOUNTER — Other Ambulatory Visit: Payer: Medicare HMO

## 2021-04-02 ENCOUNTER — Other Ambulatory Visit: Payer: Medicare HMO

## 2021-04-21 ENCOUNTER — Other Ambulatory Visit: Payer: Self-pay | Admitting: Podiatry

## 2021-04-22 NOTE — Telephone Encounter (Signed)
Please advise 

## 2021-06-24 ENCOUNTER — Other Ambulatory Visit: Payer: Self-pay | Admitting: Cardiology

## 2021-06-24 DIAGNOSIS — E7800 Pure hypercholesterolemia, unspecified: Secondary | ICD-10-CM

## 2021-08-13 ENCOUNTER — Encounter: Payer: Self-pay | Admitting: Student

## 2021-08-13 ENCOUNTER — Ambulatory Visit: Payer: Medicare HMO | Admitting: Student

## 2021-08-13 ENCOUNTER — Other Ambulatory Visit: Payer: Self-pay

## 2021-08-13 VITALS — BP 161/70 | HR 56 | Temp 97.9°F | Ht 68.0 in | Wt 182.0 lb

## 2021-08-13 DIAGNOSIS — E7800 Pure hypercholesterolemia, unspecified: Secondary | ICD-10-CM

## 2021-08-13 DIAGNOSIS — I6523 Occlusion and stenosis of bilateral carotid arteries: Secondary | ICD-10-CM

## 2021-08-13 DIAGNOSIS — I10 Essential (primary) hypertension: Secondary | ICD-10-CM

## 2021-08-13 DIAGNOSIS — I25118 Atherosclerotic heart disease of native coronary artery with other forms of angina pectoris: Secondary | ICD-10-CM

## 2021-08-13 MED ORDER — AMLODIPINE BESYLATE 5 MG PO TABS: 5.0000 mg | ORAL_TABLET | Freq: Every day | ORAL | 3 refills | Status: DC

## 2021-08-13 NOTE — Progress Notes (Signed)
Primary Physician/Referring:  Velna Hatchet, MD  Patient ID: [name], male    DOB: 1941/09/23, 80 y.o.   MRN: [id number]  Chief Complaint  Patient presents with   Coronary Artery Disease   Follow-up   Hypertension   HPI:    Anthony Adkins  is a 80 y.o. Caucasian male with hypertension, hyperlipidemia,pre-diabetes mellitus, presented with NSTEMI on 10/17/2020, underwent cardiac catheterization and stenting to the proximal LAD.   Patient presents for 91-month follow-up of CAD, hypertension, hyperlipidemia, as well as carotid artery disease.  Recent carotid duplex 02/2021 which did not note previously observed left ICA stenosis, otherwise no significant change.  Patient is feeling well overall without recurrence of chest pain.  Denies palpitations, dyspnea, orthopnea, PND, leg edema, syncope, near syncope, dizziness.  Fortunately he has not been exercising regularly over the last month or so, because he was in the process of moving, therefore he is put on approximately 5 pounds.  Past Medical History:  Diagnosis Date   Coronary artery disease    Diabetes mellitus without complication ([place])    Heart murmur    had rheumatic fever as a child   Hyperlipidemia    Hypertension    Past Surgical History:  Procedure Laterality Date   CARDIAC CATHETERIZATION     COLONOSCOPY  2009   CORONARY STENT INTERVENTION N/A 10/18/2020   Procedure: CORONARY STENT INTERVENTION;  Surgeon: Adrian Prows, MD;  Location: [facility];  Service: Cardiovascular;  Laterality: N/A;   INGUINAL HERNIA REPAIR     left side/over 20 years ago   LEFT HEART CATH AND CORONARY ANGIOGRAPHY N/A 10/18/2020   Procedure: LEFT HEART CATH AND CORONARY ANGIOGRAPHY;  Surgeon: Adrian Prows, MD;  Location: [facility];  Service: Cardiovascular;  Laterality: N/A;   lymph nodes     removed left side neck due to cat scratch fever   Family History  Problem Relation Age of Onset   Hypertension Father    Heart  disease Father    Colon cancer Neg Hx    Esophageal cancer Neg Hx    Rectal cancer Neg Hx    Stomach cancer Neg Hx     Social History   Tobacco Use   Smoking status: Former    Packs/day: 0.25    Years: 10.00    Pack years: 2.50    Types: Cigarettes    Quit date: 10/18/2020    Years since quitting: 0.8   Smokeless tobacco: Never   Tobacco comments:    Smokes around 5 cigarettes a day  Substance Use Topics   Alcohol use: Yes    Alcohol/week: 7.0 - 10.0 standard drinks    Types: 7 - 10 Standard drinks or equivalent per week   Marital Status: Married  ROS  Review of Systems  Constitutional: Negative for malaise/fatigue and weight gain.  Cardiovascular:  Negative for chest pain, claudication, leg swelling, near-syncope, orthopnea, palpitations, paroxysmal nocturnal dyspnea and syncope. Dyspnea on exertion: improving. Respiratory:  Negative for shortness of breath.   Hematologic/Lymphatic: Does not bruise/bleed easily.  Gastrointestinal:  Negative for melena.  Neurological:  Negative for dizziness and weakness.  Objective  Blood pressure (!) 161/70, pulse (!) 56, temperature 97.9 F (36.6 C), temperature source Temporal, height 5\' 8"  (1.727 m), weight 182 lb (82.6 kg), SpO2 97 %.  Vitals with BMI 08/13/2021 08/13/2021 02/11/2021  Height - 5\' 8"  5\' 8"   Weight - 182 lbs 176 lbs  BMI - 08.65 78.46  Systolic 962  568 127  Diastolic 70 78 74  Pulse 56 56 52     Physical Exam HENT:     Head: Atraumatic.  Cardiovascular:     Rate and Rhythm: Normal rate and regular rhythm.     Pulses: Intact distal pulses.          Carotid pulses are  on the right side with bruit and  on the left side with bruit.      Dorsalis pedis pulses are 2+ on the right side and 2+ on the left side.       Posterior tibial pulses are 2+ on the right side and 2+ on the left side.     Heart sounds: S1 normal and S2 normal. Murmur heard.  Midsystolic murmur is present with a grade of 2/6 at the upper right  sternal border.    No gallop.     Comments: No JVD.  Pulmonary:     Effort: Pulmonary effort is normal.     Breath sounds: Normal breath sounds. No wheezing or rales.  Musculoskeletal:     Right lower leg: Edema (trace) present.     Left lower leg: Edema (trace) present.  Skin:    General: Skin is warm and dry.  Neurological:     General: No focal deficit present.     Mental Status: He is oriented to person, place, and time.   Laboratory examination:   Recent Labs    10/17/20 1616 12/24/20 0907  NA 138 139  K 4.1 4.9  CL 103 100  CO2 25 25  GLUCOSE 129* 136*  BUN 11 12  CREATININE 0.94 0.92  CALCIUM 8.9 9.1  GFRNONAA >60  --    CrCl cannot be calculated (Patient's most recent lab result is older than the maximum 21 days allowed.).  CMP Latest Ref Rng & Units 12/24/2020 10/17/2020  Glucose 65 - 99 mg/dL 136(H) 129(H)  BUN 8 - 27 mg/dL 12 11  Creatinine 0.76 - 1.27 mg/dL 0.92 0.94  Sodium 134 - 144 mmol/L 139 138  Potassium 3.5 - 5.2 mmol/L 4.9 4.1  Chloride 96 - 106 mmol/L 100 103  CO2 20 - 29 mmol/L 25 25  Calcium 8.6 - 10.2 mg/dL 9.1 8.9   CBC Latest Ref Rng & Units 10/17/2020  WBC 4.0 - 10.5 K/uL 11.7(H)  Hemoglobin 13.0 - 17.0 g/dL 14.2  Hematocrit 39.0 - 52.0 % 42.3  Platelets 150 - 400 K/uL 234    Lipid Panel Recent Labs    10/18/20 0118  CHOL 116  TRIG 57  LDLCALC 62  VLDL 11  HDL 43  CHOLHDL 2.7    HEMOGLOBIN A1C Lab Results  Component Value Date   HGBA1C 6.2 (H) 10/17/2020   MPG 131.24 10/17/2020   TSH No results for input(s): TSH in the last 8760 hours.  External labs:   11/02/2020: Glucose 137, creatinine 0.8, GFR 93.3, sodium 130, potassium 4.6, BUN 14, CMP otherwise normal Hemoglobin 15.8, hematocrit 46.5, MCV 95.9 daily to 12, CBC otherwise normal Cholesterol 100 from triglycerides 62, HDL 35, LDL 53, non-HDL 65 Lipoprotein B 60.0 TSH 0.91 A1c 6.2%  Allergies   Allergies  Allergen Reactions   Sulfa Antibiotics Anaphylaxis,  Shortness Of Breath, Rash and Other (See Comments)    Throat and the neck both swell    Medications Prior to Visit:   Outpatient Medications Prior to Visit  Medication Sig Dispense Refill   atorvastatin (LIPITOR) 40 MG tablet TAKE 1 TABLET BY MOUTH EVERY  DAY 90 tablet 1   BAYER LOW DOSE 81 MG EC tablet Take 81 mg by mouth at bedtime. Swallow whole.     fluticasone (FLONASE) 50 MCG/ACT nasal spray Place 2 sprays into both nostrils daily as needed for rhinitis (or seasonal allergies).     loratadine (CLARITIN) 10 MG tablet Take 10 mg by mouth daily as needed for rhinitis (or seasonal allergies).     losartan (COZAAR) 50 MG tablet Take 1 tablet (50 mg total) by mouth every evening. 90 tablet 3   meloxicam (MOBIC) 15 MG tablet TAKE 1 TABLET BY MOUTH EVERY DAY 30 tablet 3   metoprolol succinate (TOPROL-XL) 50 MG 24 hr tablet Take 1 tablet (50 mg total) by mouth every evening. Take with or immediately following a meal. 90 tablet 3   Multiple Vitamins-Minerals (ONE-A-DAY MENS 50+ ADVANTAGE) TABS Take 1 tablet by mouth daily with breakfast.     nitroGLYCERIN (NITROSTAT) 0.4 MG SL tablet PLACE 1 TABLET (0.4 MG TOTAL) UNDER THE TONGUE EVERY FIVE MINUTES AS NEEDED FOR CHEST PAIN. 25 tablet 3   prasugrel (EFFIENT) 10 MG TABS tablet Take 1 tablet (10 mg total) by mouth daily. 90 tablet 3   NON FORMULARY Take 900 mg by mouth See admin instructions. CBD 900 mg gummie- Chew 1 gummie (900 mg) by mouth at bedtime (Patient not taking: Reported on 08/13/2021)     No facility-administered medications prior to visit.   Final Medications at End of Visit    Current Meds  Medication Sig   amLODipine (NORVASC) 5 MG tablet Take 1 tablet (5 mg total) by mouth daily.   atorvastatin (LIPITOR) 40 MG tablet TAKE 1 TABLET BY MOUTH EVERY DAY   BAYER LOW DOSE 81 MG EC tablet Take 81 mg by mouth at bedtime. Swallow whole.   fluticasone (FLONASE) 50 MCG/ACT nasal spray Place 2 sprays into both nostrils daily as needed for  rhinitis (or seasonal allergies).   loratadine (CLARITIN) 10 MG tablet Take 10 mg by mouth daily as needed for rhinitis (or seasonal allergies).   losartan (COZAAR) 50 MG tablet Take 1 tablet (50 mg total) by mouth every evening.   meloxicam (MOBIC) 15 MG tablet TAKE 1 TABLET BY MOUTH EVERY DAY   metoprolol succinate (TOPROL-XL) 50 MG 24 hr tablet Take 1 tablet (50 mg total) by mouth every evening. Take with or immediately following a meal.   Multiple Vitamins-Minerals (ONE-A-DAY MENS 50+ ADVANTAGE) TABS Take 1 tablet by mouth daily with breakfast.   nitroGLYCERIN (NITROSTAT) 0.4 MG SL tablet PLACE 1 TABLET (0.4 MG TOTAL) UNDER THE TONGUE EVERY FIVE MINUTES AS NEEDED FOR CHEST PAIN.   prasugrel (EFFIENT) 10 MG TABS tablet Take 1 tablet (10 mg total) by mouth daily.   Radiology:   No results found.  Cardiac Studies:  Carotid artery duplex 03/21/2021: Bilateral external carotid artery (<50%) with homogeneous plaque probable source of bruit. Antegrade right vertebral artery flow. Antegrade left vertebral artery flow. Compared to 11/22/2020, left ICA stenosis of 50-69% stenosis not noted, otherwise no significant change. Follow up in one year for stability.   PCV ECHOCARDIOGRAM COMPLETE 08/12/5101 Normal LV systolic function with visual EF 55-60%. Left ventricle cavity is normal in size. Mild left ventricular hypertrophy. Normal global wall motion. Indeterminate diastolic filling pattern, normal LAP. Left atrial cavity is severely dilated. Aortic valve sclerosis without stenosis. Mild (Grade I) aortic regurgitation. Mild (Grade I) mitral regurgitation. Mild to moderate tricuspid regurgitation. No evidence of pulmonary hypertension. Mild pulmonic regurgitation. IVC is  dilated with respiratory variation. No prior study for comparison.   Left Heart Catheterization 10/18/20:  LV: Normal LVEF, EF around 50 to 55%.  Normal EDP. RCA: Dominant.  Mild diffuse disease. LM: Mild calcification noted.   No stenosis. CX: Large vessel.  Gives origin to a large OM1, continues in the AV groove is a small vessel.  Mild diffuse disease noted. LAD: Gives origin to a small D1 following which there is a proximal LAD long tubular 80% stenosis which extends into D2, LAD itself ends immediately after giving origin to D2.  D2 is very large and essentially LAD equivalent.  Successful stenting with 3.5 x 26 mm resolute Onyx, stenosis reduced from 80% in the LAD and about 90% in the ostial D2 which is LAD equavalent to 0% with TIMI-3 to TIMI-3 flow.   Recommendation: Patient can be discharged home today.  Strict instructions regarding smoking cessation has been discussed with the patient.  He will need Brilinta 90 mg p.o. twice daily along with aspirin 81 mg daily for at least a period of 1 year in view of unstable angina.  High intensity statin with atorvastatin 80 mg, beta-blocker metoprolol succinate 50 mg daily for hypertension and CAD will also be prescribed.  150 mL contrast utilized.  EKG:  08/13/2021: Sinus bradycardia at a rate of 57 bpm.  Left axis, left anterior fascicular block.  Right bundle branch block.  Bifascicular block.  LVH.  Compared to EKG 11/01/2020, no significant change.  Assessment     ICD-10-CM   1. Coronary artery disease of native artery of native heart with stable angina pectoris ([place])  I25.118 EKG 12-Lead    2. Primary hypertension  I10 EKG 12-Lead    3. Asymptomatic bilateral carotid artery stenosis  I65.23 PCV CAROTID DUPLEX (BILATERAL)    4. Hypercholesteremia  E78.00        There are no discontinued medications.   Meds ordered this encounter  Medications   amLODipine (NORVASC) 5 MG tablet    Sig: Take 1 tablet (5 mg total) by mouth daily.    Dispense:  90 tablet    Refill:  3   Recommendations:   Anthony Adkins is a 80 y.o. Caucasian male with hypertension, hyperlipidemia, pre-diabetes mellitus, presented with NSTEMI on 10/17/2020, underwent cardiac  catheterization and stenting to the proximal LAD.   Patient presents for 107-month follow-up of CAD, hypertension, hyperlipidemia, as well as carotid artery disease.  Recent carotid duplex 02/2021 which did not note previously observed left ICA stenosis, otherwise no significant change.  Reviewed and discussed results of carotid artery duplex, details above.  Lipids are well controlled.  Patient remains relatively asymptomatic from a cardiovascular standpoint.  Patient's blood pressure is uncontrolled, will therefore add amlodipine 5 mg daily.  Patient will monitor blood pressure at home and notify our office if it remains >130/80 mmHg.  Counseled patient regarding diet and lifestyle modifications including weight loss, he appears motivated to make changes.  Patient is otherwise stable from a cardiovascular standpoint.  Follow-up in 6 months, sooner if needed, for CAD, hypertension, hyperlipidemia, carotid artery disease.   Alethia Berthold, PA-C 08/13/2021, 9:06 AM Office: [phone]

## 2021-08-28 ENCOUNTER — Other Ambulatory Visit: Payer: Self-pay | Admitting: Podiatry

## 2021-09-02 ENCOUNTER — Telehealth: Payer: Self-pay | Admitting: Student

## 2021-09-02 MED ORDER — AMLODIPINE BESYLATE 10 MG PO TABS: 10.0000 mg | ORAL_TABLET | Freq: Every day | ORAL | 3 refills | Status: AC

## 2021-09-02 NOTE — Telephone Encounter (Signed)
Patient called with home blood pressure readings since initiation of amlodipine 5 mg daily.  Blood pressure improved to 135/75 mmHg for a few days, however over the last 1 week it has increased again now averaging 145-150/80s mmHg.  Patient is tolerating amlodipine without issue, therefore increase from 5 mg to 10 mg daily. Patient will continue to monitor blood pressure and notify our office if it it remains >140/90 mmHg.    Alethia Berthold, PA-C 09/02/2021, 10:49 AM Office: [phone]

## 2021-09-18 ENCOUNTER — Telehealth: Payer: Self-pay | Admitting: Student

## 2021-09-18 NOTE — Telephone Encounter (Signed)
Pt is requesting a call from a MA, no other info provided.

## 2021-09-18 NOTE — Telephone Encounter (Signed)
Please contact patient to schedule appointment for sometime next week. Thank you.

## 2021-09-18 NOTE — Telephone Encounter (Signed)
Patient complains of SOB. Heart rate was in the 60's and he typically is in the 50's. He took a nitroglycerin, but did not have any chest pain, he only took it because it was very difficult for him to take a deep breath. He said he can come in, but he lives in [place], so is it ok for him to wait to come in next week or does he need to go to ED. I did advise him if his symptoms worsen, ED, but if he feels better these next few days, and he can wait for an appointment for next week. Please advise.

## 2021-09-18 NOTE — Telephone Encounter (Signed)
Agree. If symptoms worsen or fail to improve recommend ED or urgent care. Otherwise happy to see him next week.

## 2021-09-23 NOTE — Telephone Encounter (Signed)
Patient had cardiac cath less than 1 year ago. Would not necessarily recommend stress test. Would request that he have hospital records sent to [place] or have provider in [place] order stress test if they feel necessary. Would be happy to see him sooner for OV to discuss further and consider stress test.

## 2021-09-23 NOTE — Telephone Encounter (Signed)
Pt went to local hospital close to his house. Was recommended to have  stress test asap. Pt prefers to have it done in [place].

## 2021-10-08 NOTE — Progress Notes (Signed)
Primary Physician/Referring:  Velna Hatchet, MD  Patient ID: Anthony Adkins, male    DOB: Sep 09, 1941, 80 y.o.   MRN: [id number]  Chief Complaint  Patient presents with   Coronary Artery Disease   Chest Pain   Follow-up   HPI:    Anthony Adkins  is a 80 y.o. Caucasian male with hypertension, hyperlipidemia,pre-diabetes mellitus, presented with NSTEMI on 10/17/2020, underwent cardiac catheterization and stenting to the proximal LAD.   Patient was last seen in our office 08/13/2021 at which time he was advised to follow-up in 6 months.  However he called the office 11/7 with continued elevated blood pressure, therefore increased amlodipine from 5 mg to 10 mg daily.  Patient then again called the office 11/23 to report that he had been evaluated in the emergency department for shortness of breath in [place] and questioned whether he needed stress test.  Patient was advised that given recent cardiac catheterization would not recommend stress test at this time necessarily, however did recommend office follow-up.  Patient was again evaluated in outside ED on 09/23/2021 with shortness of breath at which time EKG was nonischemic, troponin negative, and BNP Normal.  Patient CBC did show mild anemia with hemoglobin 12.6.  PCP started patient on Ellipta daily inhaler for emphysema which is likely contributing to her shortness of breath.  Patient's PCP also started him on Bumex 0.5 mg daily.  Patient now presents for follow-up.  Patient reports shortness of breath has resolved since initiation of inhaler and Bumex.  Notably he now lives in [place] and is scheduled to establish with new cardiologist closer to his new home in February.  Patient's primary question today is whether he needs a stress test before his next office visit with new cardiologist in [place].  Denies chest pain, palpitations, dyspnea, orthopnea, PND, leg edema, syncope, near syncope, dizziness.  Past Medical History:  Diagnosis  Date   Coronary artery disease    Diabetes mellitus without complication ([place])    Heart murmur    had rheumatic fever as a child   Hyperlipidemia    Hypertension    Past Surgical History:  Procedure Laterality Date   CARDIAC CATHETERIZATION     COLONOSCOPY  2009   CORONARY STENT INTERVENTION N/A 10/18/2020   Procedure: CORONARY STENT INTERVENTION;  Surgeon: Adrian Prows, MD;  Location: [facility];  Service: Cardiovascular;  Laterality: N/A;   INGUINAL HERNIA REPAIR     left side/over 20 years ago   LEFT HEART CATH AND CORONARY ANGIOGRAPHY N/A 10/18/2020   Procedure: LEFT HEART CATH AND CORONARY ANGIOGRAPHY;  Surgeon: Adrian Prows, MD;  Location: [facility];  Service: Cardiovascular;  Laterality: N/A;   lymph nodes     removed left side neck due to cat scratch fever   Family History  Problem Relation Age of Onset   Hypertension Father    Heart disease Father    Colon cancer Neg Hx    Esophageal cancer Neg Hx    Rectal cancer Neg Hx    Stomach cancer Neg Hx     Social History   Tobacco Use   Smoking status: Former    Packs/day: 0.25    Years: 10.00    Pack years: 2.50    Types: Cigarettes    Quit date: 10/18/2020    Years since quitting: 0.9   Smokeless tobacco: Never   Tobacco comments:    Smokes around 5 cigarettes a day  Substance Use Topics  Alcohol use: Yes    Alcohol/week: 7.0 - 10.0 standard drinks    Types: 7 - 10 Standard drinks or equivalent per week   Marital Status: Married  ROS  Review of Systems  Constitutional: Negative for malaise/fatigue and weight gain.  Cardiovascular:  Negative for chest pain, claudication, dyspnea on exertion (resolved), leg swelling, near-syncope, orthopnea, palpitations, paroxysmal nocturnal dyspnea and syncope.  Respiratory:  Negative for shortness of breath.   Hematologic/Lymphatic: Does not bruise/bleed easily.  Gastrointestinal:  Negative for melena.  Neurological:  Negative for dizziness and weakness.    Objective  Blood pressure 130/65, pulse 60, temperature 98.3 F (36.8 C), temperature source Temporal, height 5\' 8"  (1.727 m), weight 185 lb (83.9 kg), SpO2 99 %.  Vitals with BMI 10/09/2021 08/13/2021 08/13/2021  Height 5\' 8"  - 5\' 8"   Weight 185 lbs - 182 lbs  BMI 34.91 - 79.15  Systolic 056 979 480  Diastolic 65 70 78  Pulse 60 56 56     Physical Exam Vitals reviewed.  Cardiovascular:     Rate and Rhythm: Normal rate and regular rhythm.     Pulses: Intact distal pulses.          Carotid pulses are  on the right side with bruit and  on the left side with bruit.      Dorsalis pedis pulses are 2+ on the right side and 2+ on the left side.       Posterior tibial pulses are 2+ on the right side and 2+ on the left side.     Heart sounds: S1 normal and S2 normal. Murmur heard.  Midsystolic murmur is present with a grade of 2/6 at the upper right sternal border.    No gallop.     Comments: No JVD.  Pulmonary:     Effort: Pulmonary effort is normal.     Breath sounds: Normal breath sounds. No wheezing or rales.  Musculoskeletal:     Right lower leg: Edema (trace) present.     Left lower leg: Edema (trace) present.   Laboratory examination:   Recent Labs    10/17/20 1616 12/24/20 0907  NA 138 139  K 4.1 4.9  CL 103 100  CO2 25 25  GLUCOSE 129* 136*  BUN 11 12  CREATININE 0.94 0.92  CALCIUM 8.9 9.1  GFRNONAA >60  --    CrCl cannot be calculated (Patient's most recent lab result is older than the maximum 21 days allowed.).  CMP Latest Ref Rng & Units 12/24/2020 10/17/2020  Glucose 65 - 99 mg/dL 136(H) 129(H)  BUN 8 - 27 mg/dL 12 11  Creatinine 0.76 - 1.27 mg/dL 0.92 0.94  Sodium 134 - 144 mmol/L 139 138  Potassium 3.5 - 5.2 mmol/L 4.9 4.1  Chloride 96 - 106 mmol/L 100 103  CO2 20 - 29 mmol/L 25 25  Calcium 8.6 - 10.2 mg/dL 9.1 8.9   CBC Latest Ref Rng & Units 10/17/2020  WBC 4.0 - 10.5 K/uL 11.7(H)  Hemoglobin 13.0 - 17.0 g/dL 14.2  Hematocrit 39.0 - 52.0 % 42.3   Platelets 150 - 400 K/uL 234    Lipid Panel Recent Labs    10/18/20 0118  CHOL 116  TRIG 57  LDLCALC 62  VLDL 11  HDL 43  CHOLHDL 2.7   HEMOGLOBIN A1C Lab Results  Component Value Date   HGBA1C 6.2 (H) 10/17/2020   MPG 131.24 10/17/2020   TSH No results for input(s): TSH in the last 8760 hours.  External labs:   11/02/2020: Glucose 137, creatinine 0.8, GFR 93.3, sodium 130, potassium 4.6, BUN 14, CMP otherwise normal Hemoglobin 15.8, hematocrit 46.5, MCV 95.9 daily to 12, CBC otherwise normal Cholesterol 100 from triglycerides 62, HDL 35, LDL 53, non-HDL 65 Lipoprotein B 60.0 TSH 0.91 A1c 6.2%  Allergies   Allergies  Allergen Reactions   Sulfa Antibiotics Anaphylaxis, Shortness Of Breath, Rash and Other (See Comments)    Throat and the neck both swell    Medications Prior to Visit:   Outpatient Medications Prior to Visit  Medication Sig Dispense Refill   amLODipine (NORVASC) 10 MG tablet Take 1 tablet (10 mg total) by mouth daily. 90 tablet 3   ANORO ELLIPTA 62.5-25 MCG/ACT AEPB Inhale 1 puff into the lungs daily.     atorvastatin (LIPITOR) 40 MG tablet TAKE 1 TABLET BY MOUTH EVERY DAY 90 tablet 1   BAYER LOW DOSE 81 MG EC tablet Take 81 mg by mouth at bedtime. Swallow whole.     bumetanide (BUMEX) 0.5 MG tablet Take 0.5 mg by mouth every morning.     fluticasone (FLONASE) 50 MCG/ACT nasal spray Place 2 sprays into both nostrils daily as needed for rhinitis (or seasonal allergies).     loratadine (CLARITIN) 10 MG tablet Take 10 mg by mouth daily as needed for rhinitis (or seasonal allergies).     losartan (COZAAR) 50 MG tablet Take 1 tablet (50 mg total) by mouth every evening. 90 tablet 3   metoprolol succinate (TOPROL-XL) 50 MG 24 hr tablet Take 1 tablet (50 mg total) by mouth every evening. Take with or immediately following a meal. 90 tablet 3   Multiple Vitamins-Minerals (ONE-A-DAY MENS 50+ ADVANTAGE) TABS Take 1 tablet by mouth daily with breakfast.      nitroGLYCERIN (NITROSTAT) 0.4 MG SL tablet PLACE 1 TABLET (0.4 MG TOTAL) UNDER THE TONGUE EVERY FIVE MINUTES AS NEEDED FOR CHEST PAIN. 25 tablet 3   prasugrel (EFFIENT) 10 MG TABS tablet Take 1 tablet (10 mg total) by mouth daily. 90 tablet 3   valACYclovir (VALTREX) 1000 MG tablet Take 1 tablet by mouth in the morning and at bedtime.     meloxicam (MOBIC) 15 MG tablet TAKE 1 TABLET BY MOUTH EVERY DAY 30 tablet 3   NON FORMULARY Take 900 mg by mouth See admin instructions. CBD 900 mg gummie- Chew 1 gummie (900 mg) by mouth at bedtime (Patient not taking: Reported on 08/13/2021)     No facility-administered medications prior to visit.   Final Medications at End of Visit    Current Meds  Medication Sig   amLODipine (NORVASC) 10 MG tablet Take 1 tablet (10 mg total) by mouth daily.   ANORO ELLIPTA 62.5-25 MCG/ACT AEPB Inhale 1 puff into the lungs daily.   atorvastatin (LIPITOR) 40 MG tablet TAKE 1 TABLET BY MOUTH EVERY DAY   BAYER LOW DOSE 81 MG EC tablet Take 81 mg by mouth at bedtime. Swallow whole.   bumetanide (BUMEX) 0.5 MG tablet Take 0.5 mg by mouth every morning.   fluticasone (FLONASE) 50 MCG/ACT nasal spray Place 2 sprays into both nostrils daily as needed for rhinitis (or seasonal allergies).   loratadine (CLARITIN) 10 MG tablet Take 10 mg by mouth daily as needed for rhinitis (or seasonal allergies).   losartan (COZAAR) 50 MG tablet Take 1 tablet (50 mg total) by mouth every evening.   metoprolol succinate (TOPROL-XL) 50 MG 24 hr tablet Take 1 tablet (50 mg total) by mouth every evening.  Take with or immediately following a meal.   Multiple Vitamins-Minerals (ONE-A-DAY MENS 50+ ADVANTAGE) TABS Take 1 tablet by mouth daily with breakfast.   nitroGLYCERIN (NITROSTAT) 0.4 MG SL tablet PLACE 1 TABLET (0.4 MG TOTAL) UNDER THE TONGUE EVERY FIVE MINUTES AS NEEDED FOR CHEST PAIN.   prasugrel (EFFIENT) 10 MG TABS tablet Take 1 tablet (10 mg total) by mouth daily.   valACYclovir (VALTREX) 1000  MG tablet Take 1 tablet by mouth in the morning and at bedtime.   [DISCONTINUED] meloxicam (MOBIC) 15 MG tablet TAKE 1 TABLET BY MOUTH EVERY DAY   Radiology:   No results found.  Cardiac Studies:  Carotid artery duplex 03/21/2021: Bilateral external carotid artery (<50%) with homogeneous plaque probable source of bruit. Antegrade right vertebral artery flow. Antegrade left vertebral artery flow. Compared to 11/22/2020, left ICA stenosis of 50-69% stenosis not noted, otherwise no significant change. Follow up in one year for stability.   PCV ECHOCARDIOGRAM COMPLETE 22/29/7989 Normal LV systolic function with visual EF 55-60%. Left ventricle cavity is normal in size. Mild left ventricular hypertrophy. Normal global wall motion. Indeterminate diastolic filling pattern, normal LAP. Left atrial cavity is severely dilated. Aortic valve sclerosis without stenosis. Mild (Grade I) aortic regurgitation. Mild (Grade I) mitral regurgitation. Mild to moderate tricuspid regurgitation. No evidence of pulmonary hypertension. Mild pulmonic regurgitation. IVC is dilated with respiratory variation. No prior study for comparison.   Left Heart Catheterization 10/18/20:  LV: Normal LVEF, EF around 50 to 55%.  Normal EDP. RCA: Dominant.  Mild diffuse disease. LM: Mild calcification noted.  No stenosis. CX: Large vessel.  Gives origin to a large OM1, continues in the AV groove is a small vessel.  Mild diffuse disease noted. LAD: Gives origin to a small D1 following which there is a proximal LAD long tubular 80% stenosis which extends into D2, LAD itself ends immediately after giving origin to D2.  D2 is very large and essentially LAD equivalent.  Successful stenting with 3.5 x 26 mm resolute Onyx, stenosis reduced from 80% in the LAD and about 90% in the ostial D2 which is LAD equavalent to 0% with TIMI-3 to TIMI-3 flow.   Recommendation: Patient can be discharged home today.  Strict instructions regarding  smoking cessation has been discussed with the patient.  He will need Brilinta 90 mg p.o. twice daily along with aspirin 81 mg daily for at least a period of 1 year in view of unstable angina.  High intensity statin with atorvastatin 80 mg, beta-blocker metoprolol succinate 50 mg daily for hypertension and CAD will also be prescribed.  150 mL contrast utilized.  EKG:  08/13/2021: Sinus bradycardia at a rate of 57 bpm.  Left axis, left anterior fascicular block.  Right bundle branch block.  Bifascicular block.  LVH.  Compared to EKG 11/01/2020, no significant change.  Assessment     ICD-10-CM   1. Coronary artery disease of native artery of native heart with stable angina pectoris ([place])  I25.118     2. Primary hypertension  I10     3. Asymptomatic bilateral carotid artery stenosis  I65.23        Medications Discontinued During This Encounter  Medication Reason   meloxicam (MOBIC) 15 MG tablet Completed Course   NON FORMULARY      No orders of the defined types were placed in this encounter.  Recommendations:   NOEMI ISHMAEL is a 80 y.o. Caucasian male with hypertension, hyperlipidemia, pre-diabetes mellitus, presented with NSTEMI on 10/17/2020, underwent  cardiac catheterization and stenting to the proximal LAD.   Patient was last seen in our office 08/13/2021 at which time he was advised to follow-up in 6 months.  However he called the office 11/7 with continued elevated blood pressure, therefore increased amlodipine from 5 mg to 10 mg daily.  Patient then again called the office 11/23 to report that he had been evaluated in the emergency department for shortness of breath in [place] and questioned whether he needed stress test.  Patient was advised that given recent cardiac catheterization would not recommend stress test at this time necessarily, however did recommend office follow-up.  Patient was again evaluated in outside ED on 09/23/2021 with shortness of breath at which time EKG was  nonischemic, troponin negative, and BNP Normal.  Patient CBC did show mild anemia with hemoglobin 12.6.  PCP started patient on Ellipta daily inhaler for emphysema which is likely contributing to her shortness of breath. Patient's PCP also started him on Bumex 0.5 mg daily.  Patient now presents for follow-up.  Patient's shortness of breath has resolved, therefore suspect it is related to underlying emphysema and potentially some volume overload.  He is scheduled for repeat lab work with PCP next week, encouraged him to keep Follow-up appointment.  He is otherwise asymptomatic from a cardiovascular standpoint.  There is no clinical evidence of heart failure at today's office visit.  Blood pressure is well controlled.  In regard to the question of stress test, advised patient then given resolution of symptoms and cardiac catheterization <1-year ago would not recommend urgent stress test at this point.  Will defer further recommendations to his new cardiologist in [place].  However did counsel patient regarding signs symptoms that would warrant more urgent or emergent evaluation, he verbalized understanding agreement.  Follow-up as needed.  I personally reviewed external records from patient's ED visits.    Alethia Berthold, PA-C 10/09/2021, 3:52 PM Office: [phone]

## 2021-10-09 ENCOUNTER — Encounter: Payer: Self-pay | Admitting: Student

## 2021-10-09 ENCOUNTER — Other Ambulatory Visit: Payer: Self-pay

## 2021-10-09 ENCOUNTER — Ambulatory Visit: Payer: Medicare HMO | Admitting: Student

## 2021-10-09 VITALS — BP 130/65 | HR 60 | Temp 98.3°F | Ht 68.0 in | Wt 185.0 lb

## 2021-10-09 DIAGNOSIS — I6523 Occlusion and stenosis of bilateral carotid arteries: Secondary | ICD-10-CM

## 2021-10-09 DIAGNOSIS — I10 Essential (primary) hypertension: Secondary | ICD-10-CM

## 2021-10-09 DIAGNOSIS — I25118 Atherosclerotic heart disease of native coronary artery with other forms of angina pectoris: Secondary | ICD-10-CM

## 2021-10-13 ENCOUNTER — Inpatient Hospital Stay
Admit: 2021-10-13 | Discharge: 2021-10-13 | Disposition: A | Discharge status: Home / Self Care | Payer: MEDICARE | Attending: Emergency Medicine

## 2021-10-13 DIAGNOSIS — R040 Epistaxis: Secondary | ICD-10-CM

## 2021-10-13 MED ORDER — AMOXICILLIN 875 MG TAB
875 mg | ORAL_TABLET | Freq: Two times a day (BID) | ORAL | 0 refills | Status: AC
Start: 2021-10-13 — End: 2021-10-20

## 2021-10-13 NOTE — ED Notes (Signed)
Nose bleed starting 45 mins PTA- nose clamp applied in triage

## 2021-10-13 NOTE — ED Notes (Signed)
Written and verbal discharge instructions reviewed with patient. All discharge medications reviewed and explained. Understanding verbalized, all questions answered. Ambulated out, gait steady.

## 2021-10-13 NOTE — ED Provider Notes (Signed)
ED Provider Notes by Delena Bali, MD at 10/13/21 1327                Author: Delena Bali, MD  Service: Emergency Medicine  Author Type: Physician       Filed: 10/13/21 1344  Date of Service: 10/13/21 1327  Status: Signed          Editor: Delena Bali, MD (Physician)               EMERGENCY DEPARTMENT HISTORY AND PHYSICAL EXAM           Date: 10/13/2021   Patient Name: Cory Robertson        History of Presenting Illness          Chief Complaint       Patient presents with        ?  Epistaxis           History Provided By: Patient      HPI: Cory Robertson, 80 y.o. male with past medical history listed below, presents via private vehicle to the ED with cc of nosebleed.  Patient  reports he started with a nosebleed about 45 minutes ago.  No trauma.  Thinks he is on a blood thinner but does not remember the name.  He takes it once a day.  He was started on it after a cardiac procedure.  He is otherwise without complaints.  Denies  any history of prior nosebleeds.  Has applied a nasal clamp prior to arrival.         There are no other complaints, changes, or physical findings at this time.      PCP: No primary care provider on file.        No current facility-administered medications on file prior to encounter.          Current Outpatient Medications on File Prior to Encounter          Medication  Sig  Dispense  Refill           ?  fluticasone propionate (FLOVENT DISKUS) 50 mcg/actuation inhaler  Take  by inhalation.         ?  bumetanide (BUMEX) 0.5 mg tablet  Take  by mouth daily.         ?  metoprolol tartrate (LOPRESSOR) 50 mg tablet  Take  by mouth two (2) times a day.         ?  valACYclovir (VALTREX) 1 gram tablet  Take  by mouth.         ?  amLODIPine (NORVASC) 10 mg tablet  Take  by mouth daily.         ?  prasugreL (EFFIENT) 5 mg tablet  Take  by mouth daily.               ?  umeclidinium-vilanteroL (Anoro Ellipta) 62.5-25 mcg/actuation inhaler  Take 1 Puff by inhalation daily.                 Past  History        Past Medical History:   History reviewed. No pertinent past medical history.      Past Surgical History:   No past surgical history on file.      Family History:   History reviewed. No pertinent family history.      Social History:          Allergies:   No Known Allergies  Review of Systems     Review of Systems    Constitutional:  Negative for activity change, chills and fever.    HENT:  Positive for nosebleeds. Negative for congestion and sore throat.     Eyes:  Negative for pain and redness.    Respiratory:  Negative for cough, chest tightness and shortness of breath.     Cardiovascular:  Negative for chest pain and palpitations.    Gastrointestinal:  Negative for abdominal pain, diarrhea, nausea and vomiting.    Genitourinary:  Negative for dysuria, frequency and urgency.    Musculoskeletal:  Negative for back pain and neck pain.    Skin:  Negative for rash.    Neurological:  Negative for syncope, light-headedness and headaches.    Psychiatric/Behavioral:  Negative for confusion.     All other systems reviewed and are negative.        Physical Exam        Physical Exam   Constitutional :        General: He is not in acute distress.      Appearance: He is well-developed. He is not diaphoretic.    HENT:       Head: Normocephalic and atraumatic.       Nose:       Comments: Left nare with anterior epistaxis.  Venous oozing no significant brisk bleeding.      Mouth/Throat:       Comments: No blood in posterior pharynx   Eyes:       General: No scleral icterus.      Conjunctiva/sclera: Conjunctivae normal.       Pupils: Pupils are equal, round, and reactive to light.     Cardiovascular:       Rate and Rhythm: Normal rate and regular rhythm.       Pulses: Normal pulses.       Heart sounds: Normal heart sounds.    Pulmonary:       Effort: Pulmonary effort is normal.       Breath sounds: Normal breath sounds.     Musculoskeletal:          General: Normal range of motion.    Skin:      General:  Skin is warm and dry.       Findings: No rash.    Neurological:       Mental Status: He is alert and oriented to person, place, and time.    Psychiatric:          Behavior: Behavior normal.                     Diagnostic Study Results        Labs -    No results found for this or any previous visit (from the past 12 hour(s)).      Radiologic Studies -      No orders to display          CT Results  (Last 48 hours)             None                    CXR Results  (Last 48 hours)             None                          Medical Decision Making  I am the first provider for this patient.      I reviewed the vital signs, available nursing notes, past medical history, past surgical history, family history and social history.      Vital Signs-Reviewed the patient's vital signs.   Patient Vitals for the past 12 hrs:            Temp  Pulse  Resp  BP  SpO2            10/13/21 1324  98.4 ??F (36.9 ??C)  69  16  (!) 144/76  98 %                 Records Reviewed: Nursing notes reviewed      Provider Notes (Medical Decision Making):    ASN:[id number] epistaxis, posterior epistaxis.      ED Course:    Initial assessment performed. The patients presenting problems have been discussed, and they are in agreement with the care plan formulated and outlined with them.  I have encouraged them to ask questions as they arise throughout their visit.          Procedure Note - Epistaxis Management:    1:43 PM   Performed by: Lasha Echeverria MD .   Complexity: Simple   the patient underwent nasal pack placement with RhinoRocket in the left nare(s)..   Hemostasis was achieved after placement.     Estimated blood loss: 1cc   The procedure took 1-15 minutes, and pt tolerated well.       PROGRESS NOTE      Pt reevaluated. Bleeding controlled with Rhino Rocket.  Gave ENT follow-up and PCP follow-up for removal.  Cover with amoxicillin until follow-up.   Written by Delena Bali, MD       Progress note:      Pt noted to be feeling better and ready for  discharge. Updated pt and/or family on all final lab and/or  imaging findings.  Will follow up as instructed. All questions have been answered, pt voiced understanding and agreement with plan.             Abx were prescribed, pt advised that diarrhea and rash are possible side effects of the medications.       Specific return precautions provided as well as instructions to return to the ED should sx worsen at any time. Vital signs stable for discharge.       I have also put together some discharge instructions for them that include: 1) educational information regarding their diagnosis, 2) how to care for their diagnosis at home, as well a 3) list of reasons why they would want to return to the ED prior to  their follow-up appointment, should their condition change.      Written by Delena Bali, MD            Critical Care Time:    0      Disposition:   Discharge      PLAN:   1.      Current Discharge Medication List                 START taking these medications          Details        amoxicillin (AMOXIL) 875 mg tablet  Take 1 Tablet by mouth two (2) times a day for 7 days.   Qty: 14 Tablet, Refills: 0   Start date: 10/13/2021, End date: 10/20/2021  2.      Follow-up Information                  Follow up With  Specialties  Details  Why  Contact Info              Wilford Sports, MD  Otolaryngology  Schedule an appointment as soon as possible for a visit in 3 days    [address] [place] [address]   [phone]                     Return to ED if worse         Diagnosis        Clinical Impression:       1.  Acute anterior epistaxis                       Please note that this dictation was completed with Dragon, the computer voice recognition software.  Quite often unanticipated grammatical, syntax, homophones, and other interpretive errors are inadvertently transcribed by the computer software.  Please  disregard these errors.  Please excuse any errors that have escaped  final proofreading.

## 2021-10-14 ENCOUNTER — Telehealth: Payer: Self-pay

## 2021-10-14 NOTE — Telephone Encounter (Signed)
Called and spoke to pt, pt voiced understanding.

## 2021-10-14 NOTE — Telephone Encounter (Signed)
Pt called and stated that he had a nose bleed over the weekend and had to have his nose packed. Dr. Lauretta Chester stated that he would like to know if he can come off of the blood thinners for a few days.Please advise [phone]

## 2021-10-14 NOTE — Telephone Encounter (Signed)
He can stop Effient altogether as he has completed a 1 year course.  He may hold aspirin for a few days and then resume.

## 2021-10-18 LAB — HEMOGLOBIN A1C: Hemoglobin A1C, External: 5.8 % — ABNORMAL HIGH (ref 0.0–5.7)

## 2021-12-08 ENCOUNTER — Other Ambulatory Visit: Payer: Self-pay | Admitting: Student

## 2021-12-08 DIAGNOSIS — I10 Essential (primary) hypertension: Secondary | ICD-10-CM

## 2021-12-11 ENCOUNTER — Other Ambulatory Visit: Payer: Self-pay | Admitting: Student

## 2021-12-11 DIAGNOSIS — I10 Essential (primary) hypertension: Secondary | ICD-10-CM

## 2022-02-03 ENCOUNTER — Ambulatory Visit: Payer: Medicare HMO | Admitting: Student

## 2022-02-14 IMAGING — DX DG CHEST 2V
2 series · 2 of 2 positions shown · non-contrast
Comparison: None.

CLINICAL DATA: Chest pain

EXAM:
CHEST - 2 VIEW

[chest pa]
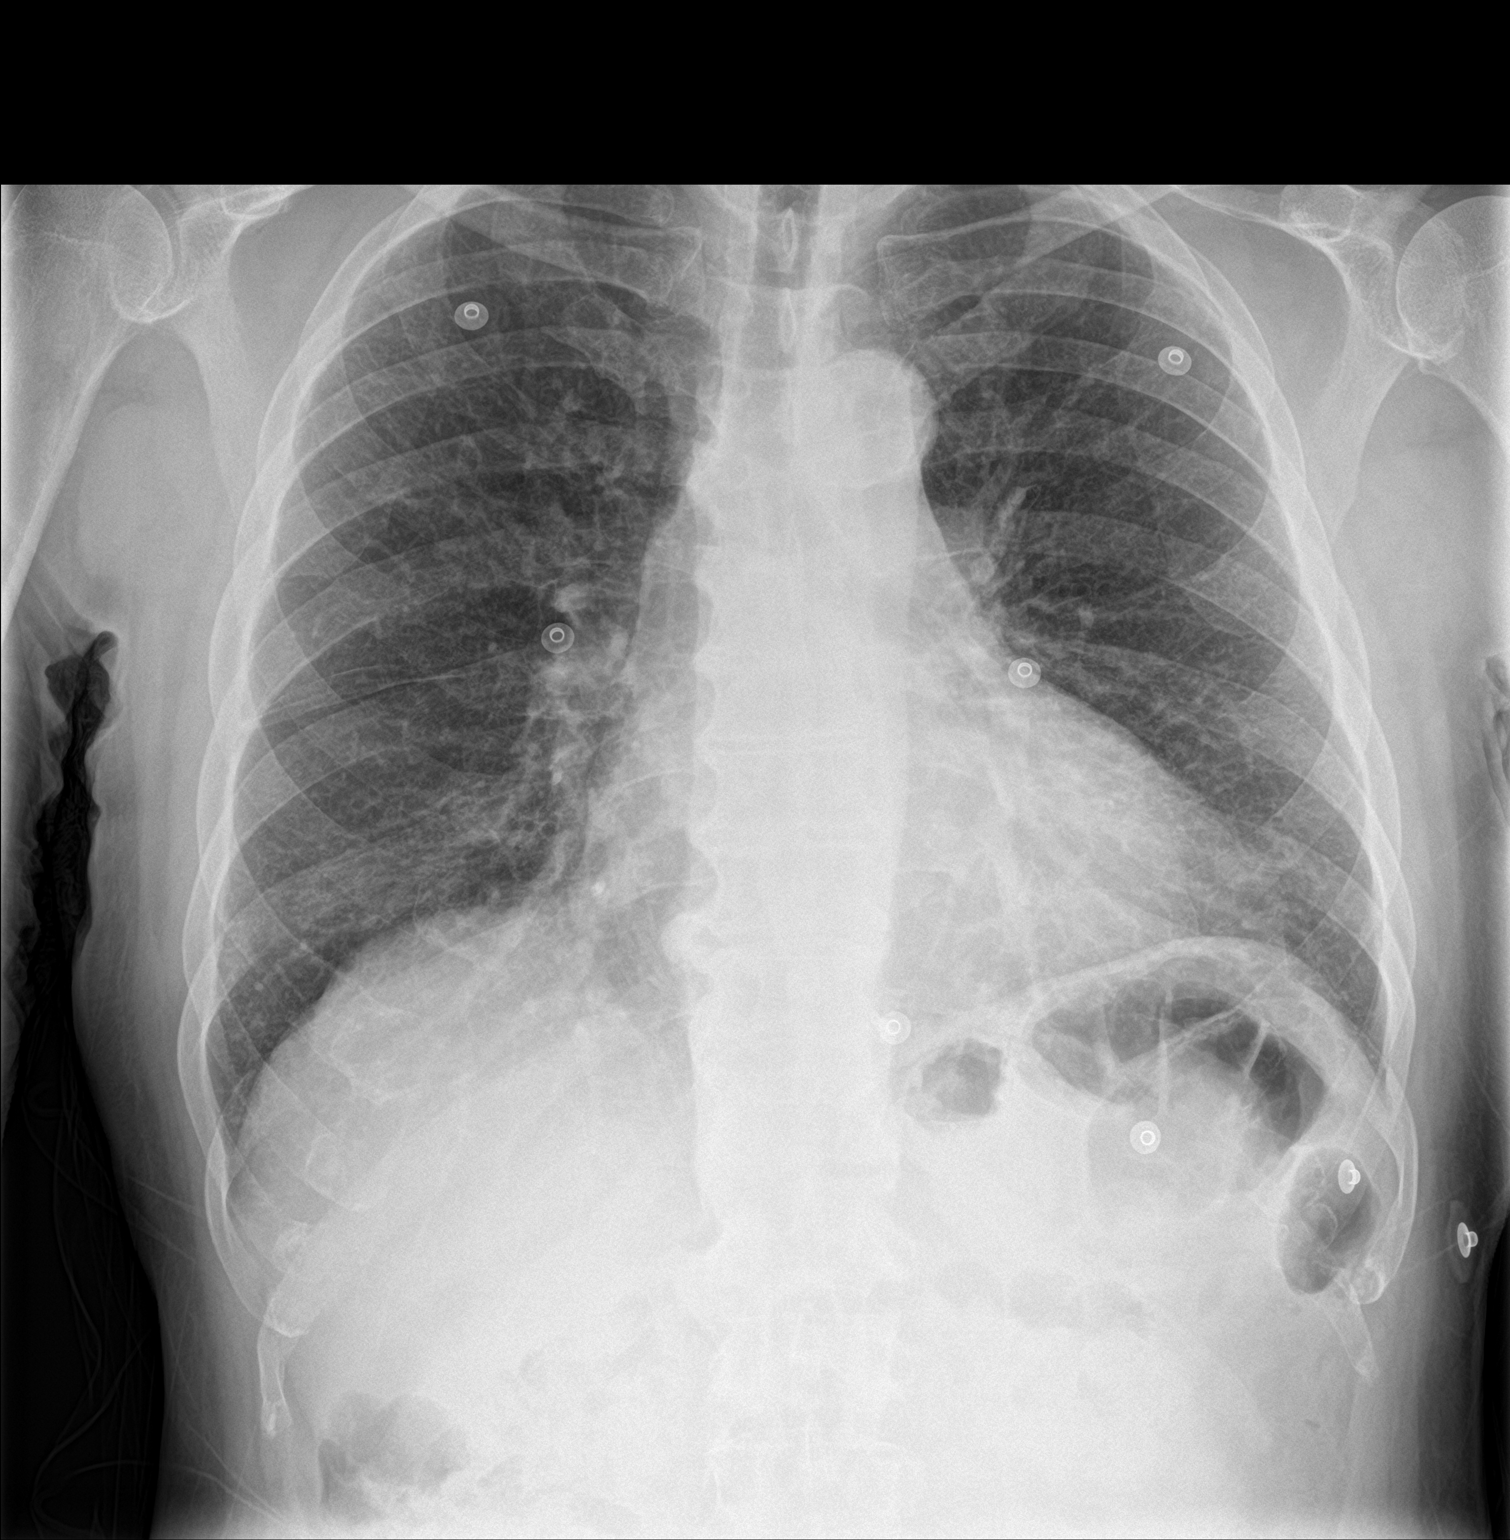

[chest lat]
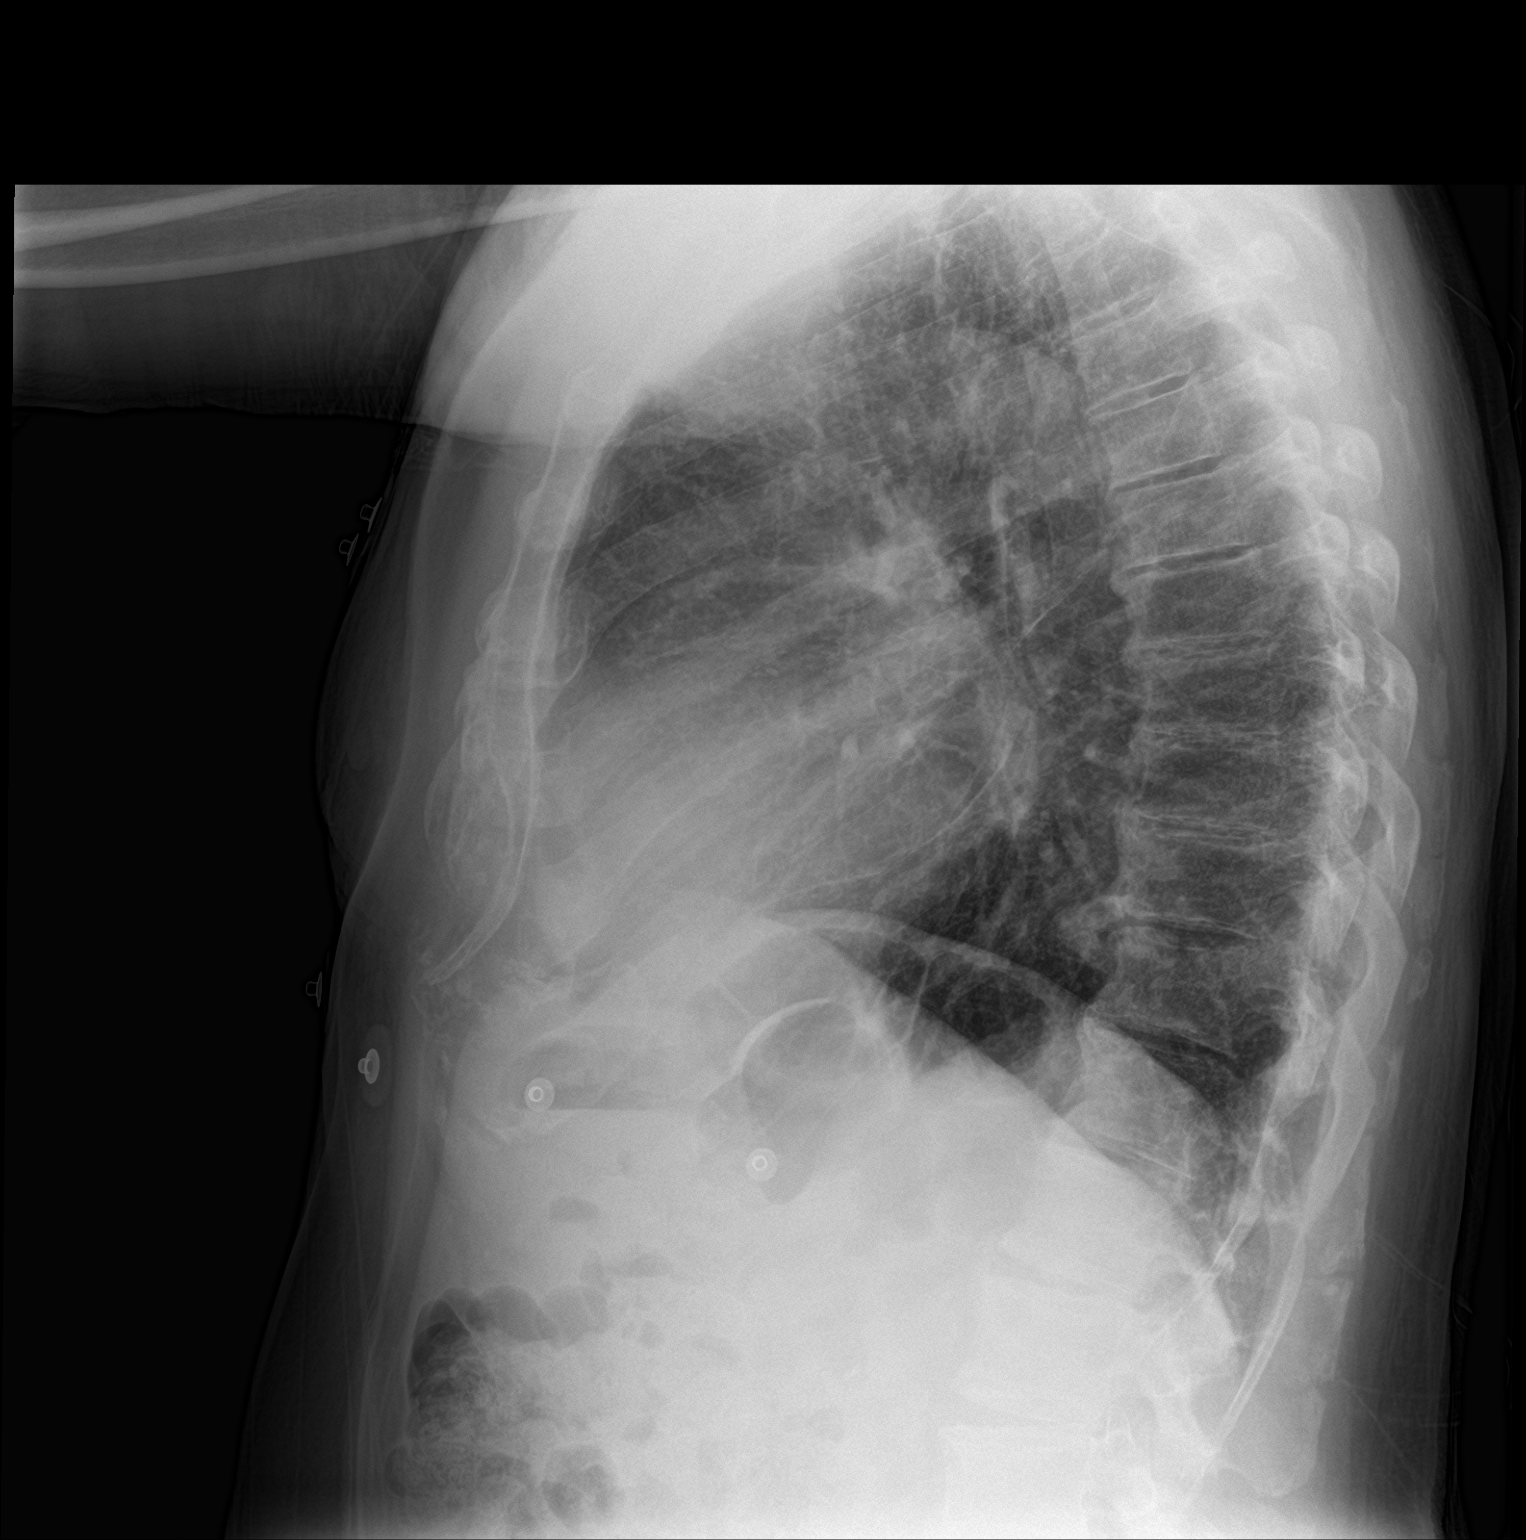

[2 of 2 positions shown; findings below may reference images not displayed]

FINDINGS: Cardiac shadow is at the upper limits of normal in size. Aortic
calcifications are noted. Lungs are well aerated without focal
infiltrate or effusion. Degenerative change thoracic spine is noted.
IMPRESSION: No acute abnormality noted.

## 2022-02-19 ENCOUNTER — Other Ambulatory Visit: Payer: Self-pay | Admitting: Student

## 2022-02-19 DIAGNOSIS — I25118 Atherosclerotic heart disease of native coronary artery with other forms of angina pectoris: Secondary | ICD-10-CM

## 2022-03-07 ENCOUNTER — Other Ambulatory Visit: Payer: Self-pay | Admitting: Student

## 2022-03-07 DIAGNOSIS — E7800 Pure hypercholesterolemia, unspecified: Secondary | ICD-10-CM

## 2022-03-11 ENCOUNTER — Other Ambulatory Visit: Payer: Self-pay | Admitting: Student

## 2022-03-11 DIAGNOSIS — E7800 Pure hypercholesterolemia, unspecified: Secondary | ICD-10-CM

## 2022-04-04 ENCOUNTER — Other Ambulatory Visit: Payer: Self-pay | Admitting: Student

## 2022-04-04 DIAGNOSIS — E7800 Pure hypercholesterolemia, unspecified: Secondary | ICD-10-CM

## 2022-04-08 ENCOUNTER — Other Ambulatory Visit: Payer: Self-pay | Admitting: Student

## 2022-04-08 DIAGNOSIS — E7800 Pure hypercholesterolemia, unspecified: Secondary | ICD-10-CM

## 2022-05-02 ENCOUNTER — Other Ambulatory Visit: Payer: Self-pay | Admitting: Student

## 2022-05-02 DIAGNOSIS — E7800 Pure hypercholesterolemia, unspecified: Secondary | ICD-10-CM

## 2022-05-06 ENCOUNTER — Other Ambulatory Visit: Payer: Self-pay | Admitting: Student

## 2022-05-06 DIAGNOSIS — E7800 Pure hypercholesterolemia, unspecified: Secondary | ICD-10-CM

## 2022-08-11 ENCOUNTER — Telehealth: Payer: Self-pay | Admitting: Cardiology

## 2022-08-11 NOTE — Telephone Encounter (Signed)
Spoke to pt, he has moved out of state and is following a cardiologist in his new home state, will no longer be following care here

## 2022-08-11 NOTE — Telephone Encounter (Signed)
Formatting of this note might be different from the original.  Spoke to pt, he has moved out of state and is following a cardiologist in his new home state, will no longer be following care here  Electronically signed by Langley Gauss at 08/11/2022  1:50 PM EDT

## 2022-10-02 ENCOUNTER — Inpatient Hospital Stay: Admit: 2022-10-02 | Payer: MEDICARE | Primary: Family Medicine

## 2024-04-24 ENCOUNTER — Emergency Department: Admit: 2024-04-24 | Payer: Medicare (Managed Care) | Primary: Family Medicine

## 2024-04-24 ENCOUNTER — Inpatient Hospital Stay
Admit: 2024-04-24 | Discharge: 2024-04-25 | Discharge status: Against Medical Advice | Payer: Medicare (Managed Care) | Arrived: VH | Attending: Emergency Medicine

## 2024-04-24 DIAGNOSIS — H40211 Acute angle-closure glaucoma, right eye: Principal | ICD-10-CM

## 2024-04-24 DIAGNOSIS — H10211 Acute toxic conjunctivitis, right eye: Principal | ICD-10-CM

## 2024-04-24 LAB — BASIC METABOLIC PANEL
Anion Gap: 9 mmol/L (ref 2–12)
BUN/Creatinine Ratio: 15 (ref 12–20)
BUN: 17 mg/dL (ref 6–20)
CO2: 26 mmol/L (ref 21–32)
Calcium: 9.5 mg/dL (ref 8.5–10.1)
Chloride: 102 mmol/L (ref 97–108)
Creatinine: 1.1 mg/dL (ref 0.70–1.30)
Est, Glom Filt Rate: 67 mL/min/{1.73_m2} (ref >60)
Glucose: 123 mg/dL — ABNORMAL HIGH (ref 65–100)
Potassium: 4.5 mmol/L (ref 3.5–5.1)
Sodium: 137 mmol/L (ref 136–145)

## 2024-04-24 LAB — CBC WITH AUTO DIFFERENTIAL
Basophils %: 0.6 % (ref 0.0–1.0)
Basophils Absolute: 0.05 10*3/uL (ref 0.00–0.10)
Eosinophils %: 2.3 % (ref 0.0–7.0)
Eosinophils Absolute: 0.21 10*3/uL (ref 0.00–0.40)
Hematocrit: 43.9 % (ref 36.6–50.3)
Hemoglobin: 15.1 g/dL (ref 12.1–17.0)
Immature Granulocytes %: 0.2 % (ref 0.0–0.5)
Immature Granulocytes Absolute: 0.02 10*3/uL (ref 0.00–0.04)
Lymphocytes %: 41.5 % (ref 12.0–49.0)
Lymphocytes Absolute: 3.74 10*3/uL — ABNORMAL HIGH (ref 0.80–3.50)
MCH: 33.9 pg (ref 26.0–34.0)
MCHC: 34.4 g/dL (ref 30.0–36.5)
MCV: 98.7 FL (ref 80.0–99.0)
MPV: 9.3 FL (ref 8.9–12.9)
Monocytes %: 7.1 % (ref 5.0–13.0)
Monocytes Absolute: 0.64 10*3/uL (ref 0.00–1.00)
Neutrophils %: 48.3 % (ref 32.0–75.0)
Neutrophils Absolute: 4.35 10*3/uL (ref 1.80–8.00)
Nucleated RBCs: 0 /100{WBCs}
Platelets: 244 10*3/uL (ref 150–400)
RBC: 4.45 M/uL (ref 4.10–5.70)
RDW: 11.6 % (ref 11.5–14.5)
WBC: 9 10*3/uL (ref 4.1–11.1)
nRBC: 0 10*3/uL (ref 0.00–0.01)

## 2024-04-24 LAB — SEDIMENTATION RATE: Sed Rate, Automated: 9 mm/h (ref 0–20)

## 2024-04-24 LAB — PROTIME-INR
INR: 3.4 — ABNORMAL HIGH (ref 0.9–1.1)
Protime: 34 s — ABNORMAL HIGH (ref 9.2–11.2)

## 2024-04-24 MED ORDER — BRIMONIDINE TARTRATE 0.2 % OP SOLN
0.2 | Freq: Once | OPHTHALMIC | Status: AC
Start: 2024-04-24 — End: 2024-04-24
  Administered 2024-04-24: 23:00:00 1 [drp] via OPHTHALMIC

## 2024-04-24 MED ORDER — IOPAMIDOL 76 % IV SOLN
76 | Freq: Once | INTRAVENOUS | Status: AC | PRN
Start: 2024-04-24 — End: 2024-04-24
  Administered 2024-04-24: 21:00:00 100 mL via INTRAVENOUS

## 2024-04-24 MED ORDER — ACETAZOLAMIDE 250 MG PO TABS
250 | ORAL_TABLET | Freq: Three times a day (TID) | ORAL | 0 refills | 90.00000 days | Status: AC
Start: 2024-04-24 — End: 2024-05-04

## 2024-04-24 MED ORDER — TIMOLOL MALEATE 0.5 % OP SOLN
0.5 | Freq: Once | OPHTHALMIC | Status: AC
Start: 2024-04-24 — End: 2024-04-24
  Administered 2024-04-24: 23:00:00 1 [drp] via OPHTHALMIC

## 2024-04-24 MED ORDER — ACETAZOLAMIDE 250 MG PO TABS
250 | Freq: Once | ORAL | Status: DC
Start: 2024-04-24 — End: 2024-04-24

## 2024-04-24 MED ORDER — PILOCARPINE HCL 1 % OP SOLN
1 | Freq: Once | OPHTHALMIC | Status: AC
Start: 2024-04-24 — End: 2024-04-24
  Administered 2024-04-24: 23:00:00 2 [drp] via OPHTHALMIC

## 2024-04-24 MED FILL — BRIMONIDINE TARTRATE 0.2 % OP SOLN: 0.2 % | OPHTHALMIC | Qty: 5 | Fill #0

## 2024-04-24 MED FILL — TIMOLOL MALEATE 0.5 % OP SOLN: 0.5 % | OPHTHALMIC | Qty: 5 | Fill #0

## 2024-04-24 MED FILL — ISOVUE-370 76 % IV SOLN: 76 % | INTRAVENOUS | Qty: 100 | Fill #0

## 2024-04-24 MED FILL — PILOCARPINE HCL 1 % OP SOLN: 1 % | OPHTHALMIC | Qty: 15 | Fill #0

## 2024-04-24 NOTE — Discharge Instructions (Signed)
 Continuity of Care Form    Patient Name: Cory Robertson   DOB:  12-25-1940  MRN:  [id number]    Admit date:  04/24/2024  Discharge date:  ***    Code Status Order: No Order   Advance Directives:     Admitting Physician:  No admitting provider for patient encounter.  PCP: Mellissa Olam BROCKS, MD    Discharging Nurse: [facility] Unit/Room#: ED04/04  Discharging Unit Phone Number: ***    Emergency Contact:   Extended Emergency Contact Information  Primary Emergency Contact: [place],Phyllis  Mobile Phone: [phone]  Relation: Spouse    Past Surgical History:  History reviewed. No pertinent surgical history.    Immunization History:   Immunization History   Administered Date(s) Administered    COVID-19, PFIZER, [id number], (age 12y+), IM, 18mcg/0.3mL 07/29/2022, 06/24/2023       Active Problems:  There is no problem list on file for this patient.      Isolation/Infection:   Isolation            No Isolation          Patient Infection Status    None to display         Nurse Assessment:  Last Vital Signs: BP 132/89   Pulse 60   Temp 98 F (36.7 C) (Oral)   Resp 18   Ht 1.753 m (5' 9)   Wt 80.7 kg (178 lb)   SpO2 95%   BMI 26.29 kg/m     Last documented pain score (0-10 scale): Pain Level: 0  Last Weight:   Wt Readings from Last 1 Encounters:   04/24/24 80.7 kg (178 lb)     Mental Status:  {IP PT MENTAL STATUS:20030}    IV Access:  {MH COC IV ACCESS:304088262}    Nursing Mobility/ADLs:  Walking   {CHP DME JIOd:695911956}  Transfer  {CHP DME JIOd:695911956}  Bathing  {CHP DME JIOd:695911956}  Dressing  {CHP DME JIOd:695911956}  Toileting  {CHP DME JIOd:695911956}  Feeding  {CHP DME JIOd:695911956}  Med Admin  {CHP DME JIOd:695911956}  Med Delivery   {MH COC MED Delivery:304088264}    Wound Care Documentation and Therapy:        Elimination:  Continence:   Bowel: {YES / WN:80272}  Bladder: {YES / WN:80272}  Urinary Catheter: {Urinary Catheter:304088013}   Colostomy/Ileostomy/Ileal Conduit: {YES /  WN:80272}       Date of Last BM: ***  No intake or output data in the 24 hours ending 04/24/24 2126  No intake/output data recorded.    Safety Concerns:     {MH COC Safety Concerns:304088272}    Impairments/Disabilities:      {MH COC Impairments/Disabilities:304088273}    Nutrition Therapy:  Current Nutrition Therapy:   {MH COC Diet List:304088271}    Routes of Feeding: {CHP DME Other Feedings:304088042}  Liquids: {Slp liquid thickness:30034}  Daily Fluid Restriction: {CHP DME Yes amt example:304088041}  Last Modified Barium Swallow with Video (Video Swallowing Test): {Done Not Done Ijuz:695911987}    Treatments at the Time of Hospital Discharge:   Respiratory Treatments: ***  Oxygen Therapy:  {Therapy; copd oxygen:17808}  Ventilator:    {MH CC Vent Opdu:695911888}    Rehab Therapies: {THERAPEUTIC INTERVENTION:210-571-2628}  Weight Bearing Status/Restrictions: {MH CC Weight Bearing:304508812}  Other Medical Equipment (for information only, NOT a DME order):  {EQUIPMENT:304520077}  Other Treatments: ***    Patient's personal belongings (please select all that are sent with patient):  {CHP DME Belongings:304088044}    RN  SIGNATURE:  {Esignature:304088025}    CASE MANAGEMENT/SOCIAL WORK SECTION    Inpatient Status Date: ***    Readmission Risk Assessment Score:  [facility] RISK OF UNPLANNED READMISSION 2.0             0 Total Score        Discharging to Facility/ Agency   Name:   Address:  Phone:  Fax:    Dialysis Facility (if applicable)   Name:  Address:  Dialysis Schedule:  Phone:  Fax:    Case Manager/Social Worker signature: {Esignature:304088025}    PHYSICIAN SECTION    Prognosis: {Prognosis:613-663-9931}    Condition at Discharge: {MH Patient Condition:304088024}    Rehab Potential (if transferring to Rehab): {Prognosis:613-663-9931}    Recommended Labs or Other Treatments After Discharge: ***    Physician Certification: I certify the above information and transfer of Cory Robertson  is necessary for the continuing treatment  of the diagnosis listed and that he requires {Admit to Appropriate Level of Care:20763} for {GREATER/LESS:304500278} 30 days.     Update Admission H&P: {CHP DME Changes in YjwiE:695911954}    PHYSICIAN SIGNATURE:  {Esignature:304088025}

## 2024-04-24 NOTE — ED Notes (Signed)
 Pharmacy has called, diamox not available in facility in [facility], only available from pharmacy which is not available in hospital at this time. ED Provider is aware.

## 2024-04-24 NOTE — ED Notes (Signed)
 Diamox unavailable in pharmacy, MD aware

## 2024-04-24 NOTE — ED Notes (Signed)
 Visual acuity    20/25 left eye   0  right eye  20/25  both eyes

## 2024-04-24 NOTE — ED Notes (Signed)
 Pt requesting to be discharged , stating  I'm tired of waiting' MD aware

## 2024-04-24 NOTE — Discharge Instructions (Signed)
 You were seen in the emergency department for sudden loss of vision in your right eye.  Initially, your intraocular pressure was 40.  You were given medications to lower your intraocular pressure which helped and brought your pressure down to 22, however you still had complete loss of vision.  We attempted to transfer you to [facility] for ophthalmology evaluation, but you decided not to go, understanding that this could mean permanent loss of sight in your right eye.  You need to be seen by an ophthalmologist as soon as possible.

## 2024-04-24 NOTE — ED Provider Notes (Signed)
 EMERGENCY DEPARTMENT HISTORY AND PHYSICAL EXAM    Date: 04/24/2024  Patient Name: Cory Robertson  Patient Age and Sex: 83 y.o. male  MRN:  [id number]  CSN:  [id number]    History of Present Illness     Chief Complaint   Patient presents with    Eye Problem     right       History Provided By: Patient and Patient's Wife    Ability to gather history was limited by:     HPI: Cory Robertson, 83 y.o. 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  No significant pain.  No headache.  Symptoms are isolated to the right eye.      Tobacco Use      Smoking status: Never      Smokeless tobacco: Not on file     Past History   The patient's medical, surgical, and social history were reviewed by me today.    Current Medications:  No current facility-administered medications on file prior to encounter.     Current Outpatient Medications on File Prior to Encounter   Medication Sig Dispense Refill    rivaroxaban (XARELTO) 20 MG TABS tablet Take 1 tablet by mouth daily      amLODIPine (NORVASC) 10 MG tablet Take by mouth daily      bumetanide (BUMEX) 0.5 MG tablet Take by mouth daily      Fluticasone Propionate, Inhal, 50 MCG/ACT AEPB Inhale into the lungs      metoprolol tartrate (LOPRESSOR) 50 MG tablet Take by mouth 2 times daily      prasugrel (EFFIENT) 5 MG TABS Take by mouth daily      umeclidinium-vilanterol (ANORO ELLIPTA) 62.5-25 MCG/ACT inhaler Inhale 1 puff into the lungs daily      valACYclovir (VALTREX) 1 g tablet Take by mouth         History reviewed. No pertinent past medical history.    History reviewed. No pertinent surgical history.    Social History     Tobacco Use    Smoking status: Never   Substance Use Topics    Alcohol use: Not Currently    Drug use: Never       Allergies:  Allergies   Allergen Reactions    Sulfa Antibiotics Anaphylaxis, Rash, Shortness Of Breath and Swelling     Other Reaction(s): Other (See Comments)    Throat and the neck both swell     Sulfur Swelling     Swelling around neck.     Review of Systems   A Review of Systems was reviewed by me today during this encounter.  Pertinent positive and negative elements are noted in the HPI and MDM sections.    Review of Systems   Eyes:  Positive for visual disturbance. Negative for photophobia and pain.   Neurological:  Negative for facial asymmetry and headaches.   All other systems reviewed and are negative.      Physical Exam   Vital Signs  No data found.         Physical Exam  Vitals reviewed.   Constitutional:       General: He is not in acute distress.     Appearance: Normal appearance. He is not ill-appearing.   Eyes:      Intraocular pressure: Right eye pressure is 40 mmHg. Measurements were taken using a handheld tonometer.     Extraocular Movements: Extraocular movements  intact.      Right eye: Normal extraocular motion.      Left eye: Normal extraocular motion.      Conjunctiva/sclera:      Right eye: Right conjunctiva is injected.        Comments: Right eye was initially normal on my initial exam, became increasingly injected with a steamy cornea while in the emergency department.    Right eye pressure was 39 and 40 on multiple measurements   Cardiovascular:      Rate and Rhythm: Normal rate and regular rhythm.   Pulmonary:      Effort: Pulmonary effort is normal. No respiratory distress.      Breath sounds: No wheezing or rhonchi.   Abdominal:      General: Abdomen is flat. There is no distension.      Palpations: Abdomen is soft.      Tenderness: There is no abdominal tenderness.   Skin:     General: Skin is warm and dry.   Neurological:      General: No focal deficit present.      Mental Status: He is alert and oriented to person, place, and time.      GCS: GCS eye subscore is 4. GCS verbal subscore is 5. GCS motor subscore is 6.      Cranial Nerves: Cranial nerves 2-12 are intact. No cranial nerve deficit, dysarthria or facial asymmetry.         Diagnostic Study Results   Labs  Results for  orders placed or performed during the hospital encounter of 04/24/24   Basic Metabolic Panel   Result Value Ref Range    Sodium 137 136 - 145 mmol/L    Potassium 4.5 3.5 - 5.1 mmol/L    Chloride 102 97 - 108 mmol/L    CO2 26 21 - 32 mmol/L    Anion Gap 9 2 - 12 mmol/L    Glucose 123 (H) 65 - 100 mg/dL    BUN 17 6 - 20 MG/DL    Creatinine 8.89 9.29 - 1.30 MG/DL    BUN/Creatinine Ratio 15 12 - 20      Est, Glom Filt Rate 67 >60 ml/min/1.53m2    Calcium 9.5 8.5 - 10.1 MG/DL   CBC with Auto Differential   Result Value Ref Range    WBC 9.0 4.1 - 11.1 K/uL    RBC 4.45 4.10 - 5.70 M/uL    Hemoglobin 15.1 12.1 - 17.0 g/dL    Hematocrit 56.0 63.3 - 50.3 %    MCV 98.7 80.0 - 99.0 FL    MCH 33.9 26.0 - 34.0 PG    MCHC 34.4 30.0 - 36.5 g/dL    RDW 88.3 88.4 - 85.4 %    Platelets 244 150 - 400 K/uL    MPV 9.3 8.9 - 12.9 FL    Nucleated RBCs 0.0 0 PER 100 WBC    nRBC 0.00 0.00 - 0.01 K/uL    Neutrophils % 48.3 32.0 - 75.0 %    Lymphocytes % 41.5 12.0 - 49.0 %    Monocytes % 7.1 5.0 - 13.0 %    Eosinophils % 2.3 0.0 - 7.0 %    Basophils % 0.6 0.0 - 1.0 %    Immature Granulocytes % 0.2 0.0 - 0.5 %    Neutrophils Absolute 4.35 1.80 - 8.00 K/UL    Lymphocytes Absolute 3.74 (H) 0.80 - 3.50 K/UL    Monocytes Absolute 0.64 0.00 - 1.00 K/UL  Eosinophils Absolute 0.21 0.00 - 0.40 K/UL    Basophils Absolute 0.05 0.00 - 0.10 K/UL    Immature Granulocytes Absolute 0.02 0.00 - 0.04 K/UL    Differential Type AUTOMATED     Sedimentation Rate   Result Value Ref Range    Sed Rate, Automated 9 0 - 20 mm/hr   C-Reactive Protein   Result Value Ref Range    CRP <0.29 0.00 - 0.30 mg/dL   Protime-INR   Result Value Ref Range    INR 3.4 (H) 0.9 - 1.1      Protime 34.0 (H) 9.2 - 11.2 sec      ==============================================================    Radiologic Studies  CTA HEAD NECK W CONTRAST   Final Result   CT Brain  demonstrates no evidence of acute brain infarct or intracranial   hemorrhage. Additional chronic brain changes as noted.       CTA demonstrates no large vessel occlusion or significant flow-limiting   stenosis. No obvious intracranial aneurysm.   No suspicious intracranial mass/enhancing lesion.   Additional incidental and chronic findings as noted.      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 Clinical correlation   advised.      Electronically signed by KATHALENE CHRIST             Critical Care and Billable Procedures   EKG reviewed by ED Physician Thornell in the absence of a cardiologist: Yes  EKG below was independently interpreted by me Alverda Thornell, MD)    Critical Care    Performed by: Thornell Berg, MD  Authorized by: Thornell Berg, MD    Critical care provider statement:     Critical care time (minutes):  40    Critical care time was exclusive of:  Separately billable procedures and treating other patients    Critical care was necessary to treat or prevent imminent or life-threatening deterioration of the following conditions:  CNS failure or compromise    Critical care was time spent personally by me on the following activities:  Ordering and review of laboratory studies, ordering and review of radiographic studies, pulse oximetry, re-evaluation of patient's condition, review of old charts, examination of patient, evaluation of patient's response to treatment and obtaining history from patient or surrogate      Medical Decision Making   I reviewed the patient's most recent Emergency Dept notes and diagnostic tests in formulating my MDM on today's visit.    Medications administered during ED course:  Medications   iopamidol (ISOVUE-370) 76 % injection 100 mL (100 mLs IntraVENous Given 04/24/24 1711)   timolol (TIMOPTIC) 0.5 % ophthalmic solution 1 drop (1 drop Right Eye Given 04/24/24 1928)   pilocarpine (PILOCAR) 1 % ophthalmic solution 2 drop (2 drops Right Eye Given 04/24/24 1923)   brimonidine (ALPHAGAN) 0.2 % ophthalmic  solution 1 drop (1 drop Right Eye Given 04/24/24 1926)     Medical Decision Making // ED Course // Reassessment:  Cory Robertson, 83 y.o. 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  No significant pain.  No headache.  Symptoms are isolated to the right eye.    Patient's initial presentation was concerning for central retinal artery occlusion, less likely CVA.  I also considered temporal arteritis.    CTA head and neck was therefore obtained, no signs of acute thromboembolism or stroke  Right eye initially appeared normal, but increasingly developed a steamy cornea and conjunctival injection.  Tono-Pen was used to measure his IOP at 39 and 40 in the right eye.    Concern for acute angle-closure glaucoma.    Patient was administered timolol, brimonidine, and pilocarpine drops.  Acetazolamide was ordered but was not available from the pharmacy.    Will plan to ensure that pressures are down around 30 at which point patient can be discharged and follow-up urgently with outpatient ophthalmology.    Signed out to Dr. Jerel to recheck eye pressures after initial treatments.      I will be the attending of record for this patient.          Vicenta Jaeger, MD      Additional documentation if relevant for this encounter     NIH Stroke Scale:  NIH Stroke Scale  NIH Stroke Scale Assessed: Yes  Level of Consciousness (1a): Alert  LOC Questions (1b): Answers both correctly  LOC Commands (1c): Performs both tasks correctly  Best Gaze (2): Normal  Visual (3): (!) Partial hemianopia  Facial Palsy (4): Normal symmetrical movement  Motor Arm, Left (5a): No drift  Motor Arm, Right (5b): No drift  Motor Leg, Left (6a): No drift  Motor Leg, Right (6b): No drift  Limb Ataxia (7): Absent  Sensory (8): Normal  Best Language (9): No aphasia  Dysarthria (10): Normal  Extinction and Inattention (11): No abnormality  Total: 1      Critical Care time exclusive of other billable  procedures:    40 minutes    ED Course Updates     ED Course as of 04/26/24 0854   Sun Apr 24, 2024   2105 Received signout on this patient from Dr. Jaeger at approximately 7:30 PM.  Plan was to recheck patient's intraocular pressure at 9 PM and reevaluate his symptoms.    Repeat intraocular pressure at 8:30 PM was 22.  Patient still reports complete loss of vision in the right eye.  He reports there is a curtain in front of his eye.  He is able to see light but cannot count fingers.  He has discoloration of cornea on exam.  We do not have working slit-lamp at this facility.  Case discussed with Dr. Lori Gatzke (ED resident on behalf of Dr. [place] Tozer) who accepts patient to [facility] ED for ophthalmology evaluation. [AO]      ED Course User Index  [AO] O'Bier, April N, MD     Diagnosis and Disposition     Disposition: Ama 04/24/2024 09:20:51 PM     Final Diagnosis:   1. Acute angle-closure glaucoma, right    2. Sudden loss of vision, right    3. Left against medical advice           Medication List        START taking these medications      acetaZOLAMIDE 250 MG tablet  Commonly known as: DIAMOX  Take 1 tablet by mouth 3 times daily for 10 days            ASK your doctor about these medications      amLODIPine 10 MG tablet  Commonly known as: NORVASC     Anoro Ellipta 62.5-25 MCG/ACT inhaler  Generic drug: umeclidinium-vilanterol     bumetanide 0.5 MG tablet  Commonly known as: BUMEX     Fluticasone Propionate (Inhal) 50 MCG/ACT diskus inhaler  Commonly known as: FLOVENT DISKUS  metoprolol tartrate 50 MG tablet  Commonly known as: LOPRESSOR     prasugrel 5 MG Tabs  Commonly known as: EFFIENT     rivaroxaban 20 MG Tabs tablet  Commonly known as: XARELTO     valACYclovir 1 g tablet  Commonly known as: VALTREX               Where to Get Your Medications        Information about where to get these medications is not yet available    Ask your nurse or doctor about these medications  acetaZOLAMIDE 250 MG tablet           Follow up:  your ophthalmologist    Go in 1 day      or [facility] ED    In 1 day         Disclaimers   I was the first provider for this patient on this visit.  To the best of my ability I reviewed relevant prior medical records, electrocardiograms, laboratories, and radiologic studies.    The patient's presenting problems were discussed, and the patient was in agreement with the care plan formulated and outlined with them.     Please note that this dictation was completed with Dragon voice recognition software. Quite often unanticipated grammatical, syntax, homophones, and other interpretive errors are inadvertently transcribed by the computer software.  Please disregard these errors and excuse any errors that have escaped final proofreading.    I personally performed the services described in this documentation on this date 04/24/24  for patient Cory Robertson.      Vicenta Jaeger, MD  8:54 AM         Jaeger Vicenta, MD  04/26/24 [phone]

## 2024-04-24 NOTE — ED Triage Notes (Signed)
 Pt arrived with complaint of eye issue.  Pt reports, my right eye started to cloud up like a film this morning, I went out to lunch and came back and laid down but it is still there.  Pt is awake alert and oriented X 4,  pt educated on ER flow.  Pt ambulated with a slow steady gait to room 4.  Pt and wife educated on ER flow

## 2024-04-25 LAB — C-REACTIVE PROTEIN: CRP: <0.29 mg/dL (ref 0.00–0.30)
